# Patient Record
Sex: Female | Born: 1960 | Race: White | Hispanic: No | Marital: Married | Smoking: Never smoker
Health system: Southern US, Community
[De-identification: ages and names within clinical notes are randomized; demographics above are authoritative.]

## PROBLEM LIST (undated history)

## (undated) DIAGNOSIS — E039 Hypothyroidism, unspecified: Secondary | ICD-10-CM

## (undated) DIAGNOSIS — Z8639 Personal history of other endocrine, nutritional and metabolic disease: Secondary | ICD-10-CM

## (undated) DIAGNOSIS — D649 Anemia, unspecified: Secondary | ICD-10-CM

---

## 1988-07-16 HISTORY — PX: DILATION AND CURETTAGE OF UTERUS: SHX78

## 2005-02-22 ENCOUNTER — Ambulatory Visit: Payer: Self-pay | Admitting: Obstetrics and Gynecology

## 2006-05-21 ENCOUNTER — Ambulatory Visit: Payer: Self-pay | Admitting: Obstetrics and Gynecology

## 2006-05-24 ENCOUNTER — Ambulatory Visit: Payer: Self-pay | Admitting: Obstetrics and Gynecology

## 2006-12-19 ENCOUNTER — Ambulatory Visit: Payer: Self-pay | Admitting: Obstetrics and Gynecology

## 2007-07-17 HISTORY — PX: BREAST CYST ASPIRATION: SHX578

## 2007-09-09 ENCOUNTER — Ambulatory Visit: Payer: Self-pay | Admitting: Obstetrics and Gynecology

## 2007-10-03 ENCOUNTER — Ambulatory Visit: Payer: Self-pay | Admitting: Internal Medicine

## 2007-10-16 ENCOUNTER — Ambulatory Visit: Payer: Self-pay | Admitting: Internal Medicine

## 2008-09-13 ENCOUNTER — Ambulatory Visit: Payer: Self-pay | Admitting: Obstetrics and Gynecology

## 2010-03-29 ENCOUNTER — Ambulatory Visit: Payer: Self-pay | Admitting: Internal Medicine

## 2010-05-24 ENCOUNTER — Ambulatory Visit: Payer: Self-pay | Admitting: Obstetrics and Gynecology

## 2012-06-10 ENCOUNTER — Ambulatory Visit: Payer: Self-pay | Admitting: Obstetrics and Gynecology

## 2013-06-23 ENCOUNTER — Ambulatory Visit: Payer: Self-pay | Admitting: Obstetrics and Gynecology

## 2014-01-29 ENCOUNTER — Emergency Department: Payer: Self-pay | Admitting: Emergency Medicine

## 2014-01-29 LAB — CBC WITH DIFFERENTIAL/PLATELET
BASOS ABS: 0.1 10*3/uL (ref 0.0–0.1)
BASOS PCT: 1.2 %
EOS ABS: 0.1 10*3/uL (ref 0.0–0.7)
Eosinophil %: 1.7 %
HCT: 39.3 % (ref 35.0–47.0)
HGB: 13.6 g/dL (ref 12.0–16.0)
LYMPHS ABS: 1.7 10*3/uL (ref 1.0–3.6)
Lymphocyte %: 34.1 %
MCH: 30.7 pg (ref 26.0–34.0)
MCHC: 34.6 g/dL (ref 32.0–36.0)
MCV: 89 fL (ref 80–100)
Monocyte #: 0.4 x10 3/mm (ref 0.2–0.9)
Monocyte %: 7.7 %
Neutrophil #: 2.8 10*3/uL (ref 1.4–6.5)
Neutrophil %: 55.3 %
Platelet: 207 10*3/uL (ref 150–440)
RBC: 4.43 10*6/uL (ref 3.80–5.20)
RDW: 12.6 % (ref 11.5–14.5)
WBC: 5 10*3/uL (ref 3.6–11.0)

## 2014-01-29 LAB — BASIC METABOLIC PANEL
Anion Gap: 2 — ABNORMAL LOW (ref 7–16)
BUN: 13 mg/dL (ref 7–18)
Calcium, Total: 10.7 mg/dL — ABNORMAL HIGH (ref 8.5–10.1)
Chloride: 107 mmol/L (ref 98–107)
Co2: 24 mmol/L (ref 21–32)
Creatinine: 0.78 mg/dL (ref 0.60–1.30)
EGFR (African American): >60
EGFR (Non-African Amer.): >60
Glucose: 133 mg/dL — ABNORMAL HIGH (ref 65–99)
OSMOLALITY: 268 (ref 275–301)
Potassium: 3.5 mmol/L (ref 3.5–5.1)
SODIUM: 133 mmol/L — AB (ref 136–145)

## 2014-01-29 LAB — TROPONIN I

## 2014-01-30 ENCOUNTER — Inpatient Hospital Stay: Payer: Self-pay | Admitting: Internal Medicine

## 2014-01-30 LAB — COMPREHENSIVE METABOLIC PANEL
ALK PHOS: 170 U/L — AB
ALT: 544 U/L — AB (ref 12–78)
Albumin: 4.1 g/dL (ref 3.4–5.0)
Anion Gap: 6 — ABNORMAL LOW (ref 7–16)
BUN: 11 mg/dL (ref 7–18)
Bilirubin,Total: 1.1 mg/dL — ABNORMAL HIGH (ref 0.2–1.0)
CALCIUM: 10.6 mg/dL — AB (ref 8.5–10.1)
CHLORIDE: 103 mmol/L (ref 98–107)
CO2: 27 mmol/L (ref 21–32)
Creatinine: 0.82 mg/dL (ref 0.60–1.30)
EGFR (African American): >60
EGFR (Non-African Amer.): >60
Glucose: 106 mg/dL — ABNORMAL HIGH (ref 65–99)
Osmolality: 272 (ref 275–301)
POTASSIUM: 3.4 mmol/L — AB (ref 3.5–5.1)
SGOT(AST): 656 U/L — ABNORMAL HIGH (ref 15–37)
SODIUM: 136 mmol/L (ref 136–145)
Total Protein: 8 g/dL (ref 6.4–8.2)

## 2014-01-30 LAB — ACETAMINOPHEN LEVEL: ACETAMINOPHEN: 5 ug/mL — AB

## 2014-01-30 LAB — URINALYSIS, COMPLETE
BACTERIA: NONE SEEN
BILIRUBIN, UR: NEGATIVE
BLOOD: NEGATIVE
Glucose,UR: NEGATIVE mg/dL (ref 0–75)
Ketone: NEGATIVE
Nitrite: NEGATIVE
PH: 5 (ref 4.5–8.0)
Protein: NEGATIVE
RBC,UR: 2 /HPF (ref 0–5)
SPECIFIC GRAVITY: 1.016 (ref 1.003–1.030)
WBC UR: 2 /HPF (ref 0–5)

## 2014-01-30 LAB — CBC WITH DIFFERENTIAL/PLATELET
BASOS ABS: 0.1 10*3/uL (ref 0.0–0.1)
Basophil %: 0.9 %
Eosinophil #: 0 10*3/uL (ref 0.0–0.7)
Eosinophil %: 0.1 %
HCT: 39.7 % (ref 35.0–47.0)
HGB: 13.8 g/dL (ref 12.0–16.0)
LYMPHS ABS: 0.2 10*3/uL — AB (ref 1.0–3.6)
LYMPHS PCT: 1.7 %
MCH: 31 pg (ref 26.0–34.0)
MCHC: 34.8 g/dL (ref 32.0–36.0)
MCV: 89 fL (ref 80–100)
Monocyte #: 0.7 x10 3/mm (ref 0.2–0.9)
Monocyte %: 7.5 %
NEUTROS ABS: 8.8 10*3/uL — AB (ref 1.4–6.5)
Neutrophil %: 89.8 %
PLATELETS: 156 10*3/uL (ref 150–440)
RBC: 4.46 10*6/uL (ref 3.80–5.20)
RDW: 12.5 % (ref 11.5–14.5)
WBC: 9.8 10*3/uL (ref 3.6–11.0)

## 2014-01-30 LAB — LIPASE, BLOOD: LIPASE: 180 U/L (ref 73–393)

## 2014-01-30 LAB — PROTIME-INR
INR: 1
Prothrombin Time: 13.1 secs (ref 11.5–14.7)

## 2014-01-31 LAB — HEPATIC FUNCTION PANEL A (ARMC)
Albumin: 3.1 g/dL — ABNORMAL LOW (ref 3.4–5.0)
Alkaline Phosphatase: 119 U/L — ABNORMAL HIGH
BILIRUBIN DIRECT: 0.2 mg/dL (ref 0.00–0.20)
Bilirubin,Total: 0.8 mg/dL (ref 0.2–1.0)
SGOT(AST): 275 U/L — ABNORMAL HIGH (ref 15–37)
SGPT (ALT): 402 U/L — ABNORMAL HIGH (ref 12–78)
TOTAL PROTEIN: 6.2 g/dL — AB (ref 6.4–8.2)

## 2014-01-31 LAB — RAPID HIV SCREEN (HIV 1/2 AB+AG)

## 2014-02-01 LAB — HEPATIC FUNCTION PANEL A (ARMC)
ALT: 270 U/L — AB (ref 12–78)
AST: 133 U/L — AB (ref 15–37)
Albumin: 2.9 g/dL — ABNORMAL LOW (ref 3.4–5.0)
Alkaline Phosphatase: 109 U/L
BILIRUBIN TOTAL: 0.5 mg/dL (ref 0.2–1.0)
Bilirubin, Direct: 0.1 mg/dL (ref 0.00–0.20)
TOTAL PROTEIN: 6.1 g/dL — AB (ref 6.4–8.2)

## 2014-02-05 LAB — CULTURE, BLOOD (SINGLE)

## 2014-11-06 NOTE — Discharge Summary (Signed)
PATIENT NAME:  Marie Griffin, Marie Griffin MR#:  [id number] DATE OF BIRTH:  06-10-1961  DATE OF ADMISSION:  01/30/2014 DATE OF DISCHARGE:  02/01/2014  DISCHARGE DIAGNOSES:  1.  Acute hepatitis, likely viral.  2.  Sepsis.  3.  Dehydration.  4.  Hypothyroidism.  5.  Gallstones.   IMAGING STUDIES: Include: 1.  A chest x-ray, PA and lateral, which showed no acute infiltrates or effusions.  2.  Ultrasound of the abdomen limited survey, right upper quadrant, showed a gallbladder with sludge with cholelithiasis, but no cholecystitis. Normal liver. Normal CBD.   ADMITTING HISTORY AND PHYSICAL: Please see detailed H and P dictated previously. In brief, a 54 year old Caucasian female patient with a history of hypothyroidism presented to the hospital complaining of abdominal pain, fever, and was also found to have elevated LFTs and admitted to the hospitalist service.   HOSPITAL COURSE: Acute hepatitis, likely viral. The patient was admitted onto the medical floor, started initially on ciprofloxacin and Flagyl for possible GI source of the fever. The patient did not have any vomiting, diarrhea. Later, antibiotics have been stopped. Blood cultures have been negative. The patient has been afebrile through her hospital stay. Tachycardia has resolved. Sepsis has resolved with IV fluids. The patient presently has pending labs of hepatitis A, B, C; Epstein-Barr virus; CMV. Her HIV test has come back negative. I have discussed with Dr. Sampson GoonFitzgerald regarding the case and provided the patient his contact details in case she has any recurrent fevers. The patient feels well by day of discharge. No pain, nausea, vomiting. Ambulated well, tolerating food, afebrile. Normal blood pressure, heart rate, and will be discharged back home. Her liver function tests have trended down from 656 and 544 of AST, ALT to 133 and 270. Bilirubin is normal. WBC count is normal. Blood cultures negative.   Patient dehydration has resolved.   PHYSICAL  EXAMINATION: Prior to discharge:  ABDOMEN: The patient has no tenderness on abdominal examination.  HEART: S1 and S2 heard without any murmurs.  LUNGS: Sound clear. EXTREMITIES:  No edema.  SKIN: No rash.   DISCHARGE MEDICATIONS: Levothyroxine 25 mcg oral 1 capsule daily.   DISCHARGE INSTRUCTIONS: Regular food, regular consistency. Return to work on 02/03/2014. Follow up with Dr. Beverely RisenFozia Khan in a week for repeat liver function tests, and please call Dr. Sampson GoonFitzgerald at [phone] if any recurrent fevers.  Time spent on day of discharge in discharge activity was 35 minutes.   ____________________________ Molinda BailiffSrikar R. Cordarrel Stiefel, MD srs:sk D: 02/01/2014 13:08:21 ET T: 02/01/2014 21:35:33 ET JOB#: [id number]  cc: Wardell HeathSrikar R. Elpidio AnisSudini, MD, <Dictator> Lyndon CodeFozia M. Khan, MD Stann Mainlandavid P. Sampson GoonFitzgerald, MD  Orie FishermanSRIKAR R Romani Wilbon MD ELECTRONICALLY SIGNED 02/03/2014 17:23

## 2014-11-06 NOTE — H&P (Signed)
PATIENT NAME:  Marie SkeansRUITT, Astou MR#:  [id number] DATE OF BIRTH:  [birth year]  DATE OF ADMISSION:  01/30/2014  REFERRING PHYSICIAN: Dr. Mayford KnifeWilliams  PRIMARY CARE PHYSICIAN: Dr. Beverely RisenFozia Khan.   CHIEF COMPLAINT: Abdominal pain, fever.   HISTORY OF PRESENT ILLNESS: A 54 year old Caucasian female with history of hypothyroidism, presenting with abdominal pain and fever. She describes two day duration of abdominal pain, epigastric in location, sharp in quality, nonradiating, 10 out of 10 in intensity, intermittent. No worsening or relieving factors. Not associated with food. She actually presented to the hospital yesterday. Her work-up was unrevealing, and she was sent home from the Emergency Department; however, her symptoms recurred, so she has had to re-present to the hospital. She also describes having associated fevers, chills, fatigue, generalized weakness. During initial work-up, noted to be febrile as well as have elevated LFTs. She denies taking any over-the-counter medications or herbal supplements. [place] recent travel [place] than [place] A few months ago, she traveled to [place], about 3 to 4 months ago. She is on testosterone  injections for menopause.    REVIEW OF SYSTEMS:  CONSTITUTIONAL: Positive for fevers, chills, fatigue, weakness as described above.  EYES: Denied blurred vision, double vision or eye pain.  EARS, NOSE, THROAT: Denies tinnitus, ear pain, hearing loss.  RESPIRATORY: Denies cough, wheeze, shortness of breath.  CARDIOVASCULAR: Denies chest pain, palpitations, edema.  GASTROINTESTINAL: Denies nausea, vomiting, diarrhea. Positive for abdominal pain as described above.  GENITOURINARY: Denies dysuria, hematuria.  ENDOCRINE: Denies nocturia or thyroid problems.  HEMATOLOGIC AND LYMPHATIC:  Denies easy bruising, bleeding.  SKIN: Denies rashes or lesions.  MUSCULOSKELETAL: Denies pain in neck, back, shoulder, knees, hips or arthritic symptoms.  NEUROLOGIC: Denies any paralysis or  paresthesias.  PSYCHIATRIC: Denies anxiety or depressive symptoms.  Otherwise, full review of systems is reviewed and is negative.   PAST MEDICAL HISTORY: Hypothyroidism.   SOCIAL HISTORY: Denies any tobacco use. Occasional alcohol use, 1 to 2 alcoholic beverages on weekends. Denies any drug use.   FAMILY HISTORY: Positive for coronary artery disease; however, denies any hepatobiliary illnesses.   ALLERGIES: SULFA DRUGS.   HOME MEDICATIONS: Include levothyroxine 25 mcg p.o. daily.   PHYSICAL EXAMINATION: VITAL SIGNS: Temperature 102.8 degrees Fahrenheit, heart rate 125, respirations 18, blood pressure 125/67, saturating 95% on room air. Weight 56.7 kg, BMI 22.9.  GENERAL: Well appearing Caucasian female who is currently in no acute distress.  HEAD: Normocephalic, atraumatic.  EYES: Pupils equal, round, reactive to light. Extraocular muscles intact. No scleral icterus.  MOUTH: Moist mucous membranes. Dentition intact. No abscess noted.  EARS, NOSE, AND THROAT: Clear, without exudates. No external lesions.  NECK: Supple. No thyromegaly. No nodules. No JVD.  PULMONARY: Clear to auscultation bilaterally without wheezes, rubs or rhonchi. No use of accessory muscles. Good respiratory effort.  CHEST: Nontender to palpation.  CARDIOVASCULAR: S1, S2. Regular rate and rhythm. No murmurs, rubs, or gallops. No edema. Pedal pulses 2+ bilaterally.  GASTROINTESTINAL: Soft, nondistended. Minimal tenderness over epigastric region without rebound or guarding. Murphy's sign is negative. Positive bowel sounds. No hepatosplenomegaly.  MUSCULOSKELETAL: No swelling, clubbing, or edema. Range of motion full in all extremities.  NEUROLOGIC: Cranial nerves II through XII intact. No gross focal neurological deficits. Sensation intact. Reflexes are intact.  SKIN: No ulceration, lesions, rashes, or cyanosis. Skin warm, dry. Turgor intact.  PSYCHIATRIC: Mood and affect within normal limits. The patient is awake,  alert, oriented x3. Insight and judgment intact.   LABORATORY DATA: Sodium 136, potassium 3.4, chloride 103,  bicarbonate 27, BUN 11, creatinine 0.82, glucose 106, calcium 10.6. LFTs: Protein 8, albumin 4.1, bilirubin 1.1, alkaline phosphatase 170, AST 656, ALT 544. WBC 9.8, hemoglobin 13.8, platelets of 156. Urinalysis negative for evidence of infection. INR of 1. Had a chest x-ray performed yesterday, revealed no acute cardiopulmonary process. Had an ultrasound of the right upper quadrant performed today, reveals gallbladder sludge with cholelithiasis, without evidence of acute cholecystitis.   ASSESSMENT AND PLAN: A 54 year old Caucasian female with history of hypothyroidism, presenting with abdominal pain and fever of two days' duration, noted to have elevated liver function tests.  1.  Hepatitis with hepatocellular injury pattern. No clear etiology at this time. Hepatitis panel has been ordered. Of note, she is on testosterone supplementation for about 3 to 4 years in total. These can cause elevations in liver function tests, but it is usually cholestatic versus a mixed pattern picture, as opposed to hepatocellular.  2.  Sepsis, meeting septic criteria by heart rate as well as temperature, with presumptive gastrointestinal source. Cipro, Flagyl antibiotic coverage. Follow cultures. IV fluid hydration. Keep mean arterial pressure greater than 65.  3.  Hypokalemia. Replace to goal of 4 to 5.  4.  Hypothyroidism. Continue with Synthroid.  5.  Deep venous thrombosis prophylaxis with heparin subcutaneous.   CODE STATUS: The patient is full code.   TIME SPENT: 45 minutes.   ____________________________ Cletis Athens. Deaire Mcwhirter, MD dkh:cg D: 01/31/2014 00:12:14 ET T: 01/31/2014 00:52:18 ET JOB#: [id number]  cc: Cletis Athens. Moriya Mitchell, MD, <Dictator> Jozsef Wescoat Synetta Shadow MD ELECTRONICALLY SIGNED 01/31/2014 20:28

## 2014-12-21 ENCOUNTER — Other Ambulatory Visit: Payer: Self-pay | Admitting: Nurse Practitioner

## 2014-12-21 ENCOUNTER — Ambulatory Visit
Admission: RE | Admit: 2014-12-21 | Discharge: 2014-12-21 | Disposition: A | Discharge status: Home / Self Care | Payer: 59 | Source: Ambulatory Visit | Attending: Nurse Practitioner | Admitting: Nurse Practitioner

## 2014-12-21 DIAGNOSIS — M25561 Pain in right knee: Secondary | ICD-10-CM | POA: Insufficient documentation

## 2014-12-21 DIAGNOSIS — M79651 Pain in right thigh: Secondary | ICD-10-CM

## 2014-12-21 DIAGNOSIS — M25551 Pain in right hip: Secondary | ICD-10-CM

## 2015-04-12 ENCOUNTER — Other Ambulatory Visit: Payer: Self-pay | Admitting: Obstetrics and Gynecology

## 2015-09-26 ENCOUNTER — Other Ambulatory Visit: Payer: Self-pay | Admitting: Obstetrics and Gynecology

## 2015-09-26 DIAGNOSIS — Z1231 Encounter for screening mammogram for malignant neoplasm of breast: Secondary | ICD-10-CM

## 2015-10-03 ENCOUNTER — Ambulatory Visit
Admission: RE | Admit: 2015-10-03 | Discharge: 2015-10-03 | Disposition: A | Discharge status: Home / Self Care | Payer: 59 | Source: Ambulatory Visit | Attending: Obstetrics and Gynecology | Admitting: Obstetrics and Gynecology

## 2015-10-03 DIAGNOSIS — Z1231 Encounter for screening mammogram for malignant neoplasm of breast: Secondary | ICD-10-CM | POA: Diagnosis not present

## 2015-12-30 ENCOUNTER — Encounter: Admission: RE | Payer: Self-pay | Source: Ambulatory Visit

## 2015-12-30 ENCOUNTER — Ambulatory Visit: Admission: RE | Admit: 2015-12-30 | Payer: 59 | Source: Ambulatory Visit | Admitting: Gastroenterology

## 2015-12-30 SURGERY — COLONOSCOPY WITH PROPOFOL
Anesthesia: General

## 2016-07-07 ENCOUNTER — Encounter: Payer: Self-pay | Admitting: Emergency Medicine

## 2016-07-07 DIAGNOSIS — R1013 Epigastric pain: Secondary | ICD-10-CM | POA: Diagnosis present

## 2016-07-07 DIAGNOSIS — E039 Hypothyroidism, unspecified: Secondary | ICD-10-CM | POA: Diagnosis not present

## 2016-07-07 DIAGNOSIS — K8020 Calculus of gallbladder without cholecystitis without obstruction: Secondary | ICD-10-CM | POA: Insufficient documentation

## 2016-07-07 LAB — URINALYSIS, COMPLETE (UACMP) WITH MICROSCOPIC
BACTERIA UA: NONE SEEN
Bilirubin Urine: NEGATIVE
GLUCOSE, UA: NEGATIVE mg/dL
HGB URINE DIPSTICK: NEGATIVE
Ketones, ur: NEGATIVE mg/dL
NITRITE: NEGATIVE
PROTEIN: NEGATIVE mg/dL
Specific Gravity, Urine: 1.006 (ref 1.005–1.030)
pH: 5 (ref 5.0–8.0)

## 2016-07-07 LAB — LIPASE, BLOOD: LIPASE: 35 U/L (ref 11–51)

## 2016-07-07 LAB — COMPREHENSIVE METABOLIC PANEL
ALT: 24 U/L (ref 14–54)
ANION GAP: 9 (ref 5–15)
AST: 44 U/L — AB (ref 15–41)
Albumin: 4.8 g/dL (ref 3.5–5.0)
Alkaline Phosphatase: 51 U/L (ref 38–126)
BUN: 12 mg/dL (ref 6–20)
CO2: 24 mmol/L (ref 22–32)
Calcium: 11.4 mg/dL — ABNORMAL HIGH (ref 8.9–10.3)
Chloride: 107 mmol/L (ref 101–111)
Creatinine, Ser: 0.76 mg/dL (ref 0.44–1.00)
Glucose, Bld: 109 mg/dL — ABNORMAL HIGH (ref 65–99)
POTASSIUM: 3.6 mmol/L (ref 3.5–5.1)
Sodium: 140 mmol/L (ref 135–145)
Total Bilirubin: 0.7 mg/dL (ref 0.3–1.2)
Total Protein: 7.5 g/dL (ref 6.5–8.1)

## 2016-07-07 LAB — CBC
HEMATOCRIT: 37.3 % (ref 35.0–47.0)
Hemoglobin: 13.1 g/dL (ref 12.0–16.0)
MCH: 30.9 pg (ref 26.0–34.0)
MCHC: 35.2 g/dL (ref 32.0–36.0)
MCV: 87.8 fL (ref 80.0–100.0)
Platelets: 218 10*3/uL (ref 150–440)
RBC: 4.25 MIL/uL (ref 3.80–5.20)
RDW: 12.1 % (ref 11.5–14.5)
WBC: 5.6 10*3/uL (ref 3.6–11.0)

## 2016-07-07 NOTE — ED Triage Notes (Signed)
[facility], reports upper abd pain intermittent over past couple days, described as squeezing, reports worse after eating.  Pt reports she was told she had gall stones year ago.

## 2016-07-08 ENCOUNTER — Emergency Department: Payer: 59

## 2016-07-08 ENCOUNTER — Emergency Department
Admission: EM | Admit: 2016-07-08 | Discharge: 2016-07-08 | Disposition: A | Discharge status: Home / Self Care | Payer: 59 | Attending: Emergency Medicine | Admitting: Emergency Medicine

## 2016-07-08 DIAGNOSIS — K8020 Calculus of gallbladder without cholecystitis without obstruction: Secondary | ICD-10-CM

## 2016-07-08 DIAGNOSIS — R1013 Epigastric pain: Secondary | ICD-10-CM

## 2016-07-08 DIAGNOSIS — R109 Unspecified abdominal pain: Secondary | ICD-10-CM

## 2016-07-08 HISTORY — DX: Hypothyroidism, unspecified: E03.9

## 2016-07-08 LAB — TROPONIN I: Troponin I: <0.03 ng/mL (ref <0.03)

## 2016-07-08 MED ORDER — OXYCODONE-ACETAMINOPHEN 5-325 MG PO TABS
1.0000 | ORAL_TABLET | Freq: Once | ORAL | Status: AC
Start: 2016-07-08 — End: 2016-07-08
  Administered 2016-07-08: 1 via ORAL
  Filled 2016-07-08: qty 1

## 2016-07-08 MED ORDER — FENTANYL CITRATE (PF) 100 MCG/2ML IJ SOLN
INTRAMUSCULAR | Status: AC
  Administered 2016-07-08: 50 ug via NASAL
  Filled 2016-07-08: qty 2

## 2016-07-08 MED ORDER — ONDANSETRON 4 MG PO TBDP
4.0000 mg | ORAL_TABLET | Freq: Once | ORAL | Status: AC | PRN
  Administered 2016-07-08: 4 mg via ORAL

## 2016-07-08 MED ORDER — MORPHINE SULFATE (PF) 4 MG/ML IV SOLN
4.0000 mg | Freq: Once | INTRAVENOUS | Status: AC
  Administered 2016-07-08: 4 mg via INTRAVENOUS
  Filled 2016-07-08: qty 1

## 2016-07-08 MED ORDER — SUCRALFATE 1 G PO TABS: 1.0000 g | ORAL_TABLET | Freq: Two times a day (BID) | ORAL | 0 refills | Status: DC

## 2016-07-08 MED ORDER — ONDANSETRON 4 MG PO TBDP
ORAL_TABLET | ORAL | Status: DC
Start: 2016-07-08 — End: 2016-07-08
  Filled 2016-07-08: qty 1

## 2016-07-08 MED ORDER — GI COCKTAIL ~~LOC~~
30.0000 mL | Freq: Once | ORAL | Status: AC
  Administered 2016-07-08: 30 mL via ORAL
  Filled 2016-07-08: qty 30

## 2016-07-08 MED ORDER — ONDANSETRON 4 MG PO TBDP: 4.0000 mg | ORAL_TABLET | Freq: Three times a day (TID) | ORAL | 0 refills | Status: DC | PRN

## 2016-07-08 MED ORDER — FENTANYL CITRATE (PF) 100 MCG/2ML IJ SOLN
50.0000 ug | INTRAMUSCULAR | Status: DC | PRN
  Administered 2016-07-08: 50 ug via NASAL

## 2016-07-08 MED ORDER — OXYCODONE-ACETAMINOPHEN 5-325 MG PO TABS: 1.0000 | ORAL_TABLET | Freq: Four times a day (QID) | ORAL | 0 refills | Status: DC | PRN

## 2016-07-08 NOTE — ED Notes (Signed)
Pt presents to ED 12 c/o RUQ squeezing pain radiating to the back x2 days, pt states pain progressively getting worse, states pain at 8/10 right now.  Pt states history of being diagnosed with gall stones about a year ago. Pt denies any vomiting or diarrhea or blood in stool; pt states experiencing nausea. Pt is alert and oriented x4

## 2016-07-08 NOTE — ED Provider Notes (Signed)
[facility] Emergency Department Provider Note   ____________________________________________   First MD Initiated Contact with Patient 07/08/16 0157     (approximate)  I have reviewed the triage vital signs and the nursing notes.   HISTORY  Chief Complaint Abdominal Pain    HPI Marie Griffin is a 55 y.o. female who comes into the hospital today with some abdominal pain. The patient reports that she is having a gallbladder attack. She reports this started on Friday afternoon. The patient had some epigastric pain and tightness. She also had pressure into her sides. It lasted about 15 minutes and then went away. She reports that she had another episode this morning and then again this evening. She reports that she's had some nausea with no vomiting and denies any fever. About a year and a half ago the patient had similar symptoms and was told that she had stones and sludge in her gallbladder. The patient never followed up with the surgeon and she reports that she didn't think it was a big deal. He problems with that since. The patient reports that when the pain comes on it does seem to take her breath away but otherwise denies any chest pain, shortness of breath, diarrhea or constipation. The patient is here this evening for evaluation and control of her pain. The patient rates her pain a 5 out of 10 in intensity.   Past Medical History:  Diagnosis Date  . Hypothyroid     There are no active problems to display for this patient.   Past Surgical History:  Procedure Laterality Date  . BREAST CYST ASPIRATION Right 2009   neg    Prior to Admission medications   Not on File    Allergies Sulfa antibiotics  Family History  Problem Relation Age of Onset  . Breast cancer Neg Hx     Social History Social History  Substance Use Topics  . Smoking status: Never Smoker  . Smokeless tobacco: Never Used  . Alcohol use Yes    Review of  Systems Constitutional: No fever/chills Eyes: No visual changes. ENT: No sore throat. Cardiovascular: Denies chest pain. Respiratory: Denies shortness of breath. Gastrointestinal:  abdominal pain, nausea, no vomiting.  No diarrhea.  No constipation. Genitourinary: Negative for dysuria. Musculoskeletal: Negative for back pain. Skin: Negative for rash. Neurological: Negative for headaches, focal weakness or numbness.  10-point ROS otherwise negative.  ____________________________________________   PHYSICAL EXAM:  VITAL SIGNS: ED Triage Vitals  Enc Vitals Group     BP 07/07/16 2234 124/74     Pulse Rate 07/07/16 2234 72     Resp 07/07/16 2234 18     Temp 07/07/16 2234 97.6 F (36.4 C)     Temp Source 07/07/16 2234 Oral     SpO2 07/07/16 2234 100 %     Weight 07/07/16 2235 125 lb (56.7 kg)     Height 07/07/16 2235 5\' 3"  (1.6 m)     Head Circumference --      Peak Flow --      Pain Score 07/07/16 2235 5     Pain Loc --      Pain Edu? --      Excl. in GC? --     Constitutional: Alert and oriented. Well appearing and in moderate distress. Eyes: Conjunctivae are normal. PERRL. EOMI. Head: Atraumatic. Nose: No congestion/rhinnorhea. Mouth/Throat: Mucous membranes are moist.  Oropharynx non-erythematous. Cardiovascular: Normal rate, regular rhythm. Grossly normal heart sounds.  Good peripheral circulation. Respiratory:  Normal respiratory effort.  No retractions. Lungs CTAB. Gastrointestinal: Sof twith epigastric tenderness to palpation. No distention. Positive bowel sounds Musculoskeletal: No lower extremity tenderness nor edema.   Neurologic:  Normal speech and language.  Skin:  Skin is warm, dry and intact.  Psychiatric: Mood and affect are normal.   ____________________________________________   LABS (all labs ordered are listed, but only abnormal results are displayed)  Labs Reviewed  COMPREHENSIVE METABOLIC PANEL - Abnormal; Notable for the following:        Result Value   Glucose, Bld 109 (*)    Calcium 11.4 (*)    AST 44 (*)    All other components within normal limits  URINALYSIS, COMPLETE (UACMP) WITH MICROSCOPIC - Abnormal; Notable for the following:    Color, Urine YELLOW (*)    APPearance CLEAR (*)    Leukocytes, UA TRACE (*)    Squamous Epithelial / LPF 0-5 (*)    All other components within normal limits  LIPASE, BLOOD  CBC  TROPONIN I   ____________________________________________  EKG  none ____________________________________________  RADIOLOGY  US abd RUQ ____________________________________________   PROCEDURES  Procedure(s) performed: None  Procedures  Critical Care performed: No  ____________________________________________   INITIAL IMPRESSION / ASSESSMENT AND PLAN / ED COURSE  Pertinent labs & imaging results that were available during my care of the patient were reviewed by me and considered in my medical decision making (see chart for details).  This is a 55 year old female who comes into the hospital today with some epigastric and right upper quadrant abdominal pain. The patient reports that this is a gallbladder attack. She does have stones in her gallbladder.  We did send some blood work to evaluate the patient and it is unremarkable. The patient's lipase is negative and the remaining blood work is also negative. I sent the patient for an ultrasound of her gallbladder. I gave the patient a dose of morphine, Zofran and a GI cocktail. I will reassess the patient.  Clinical Course as of Jul 09 423  Wynelle LinkSun Jul 08, 2016  [id number] Cholelithiasis. Generous caliber of the extrahepatic ducts. Cannot exclude choledocholithiasis.   US Abdomen Limited RUQ [AW]    Clinical Course User Index [AW] Rebecka ApleyAllison P Savino Whisenant, MD   After the medication the patient reports her pain is improved. She does have cholelithiasis with some generous extrahepatic ducts but no signs of cholecystitis. As the patient's pain is  improved and her liver enzymes and blood work is unremarkable he'll that the patient can be discharged home. I discussed with the patient that I will have her follow-up with surgery to have her gallbladder removed. I did inform her though that should anything worsen she should return to emergency department for further evaluation. The patient received a dose of Percocet to help with her pain should it return when she arrives home and I will give the patient a prescription for medication. She'll be discharged to follow-up.  ____________________________________________   FINAL CLINICAL IMPRESSION(S) / ED DIAGNOSES  Final diagnoses:  Abdominal pain      NEW MEDICATIONS STARTED DURING THIS VISIT:  New Prescriptions   No medications on file     Note:  This document was prepared using Dragon voice recognition software and may include unintentional dictation errors.    Rebecka ApleyAllison P Reeanna Acri, MD 07/08/16 [phone]

## 2016-07-08 NOTE — Discharge Instructions (Signed)
Please follow-up with the surgeon for further evaluation of her gallbladder. If the pain should return or worsen or if he have any further symptoms return to the emergency department.

## 2016-07-08 NOTE — ED Notes (Signed)
Pt is in good condition; discharge instructions reviewed, follow up care and home care reviewed; prescription medication reviewed; pt verbalized understanding; pt is ambulatory and left with husband

## 2016-07-10 ENCOUNTER — Other Ambulatory Visit: Payer: Self-pay

## 2016-07-10 DIAGNOSIS — E079 Disorder of thyroid, unspecified: Secondary | ICD-10-CM | POA: Insufficient documentation

## 2016-07-12 ENCOUNTER — Encounter: Payer: Self-pay | Admitting: Surgery

## 2016-07-12 ENCOUNTER — Telehealth: Payer: Self-pay | Admitting: Surgery

## 2016-07-12 ENCOUNTER — Ambulatory Visit (INDEPENDENT_AMBULATORY_CARE_PROVIDER_SITE_OTHER): Payer: 59 | Admitting: Surgery

## 2016-07-12 VITALS — BP 128/88 | HR 90 | Temp 98.3°F | Ht 63.0 in | Wt 125.2 lb

## 2016-07-12 DIAGNOSIS — K8050 Calculus of bile duct without cholangitis or cholecystitis without obstruction: Secondary | ICD-10-CM

## 2016-07-12 NOTE — Progress Notes (Signed)
  Surgical Consultation  07/12/2016  Marie SkeansLori Griffin is an 55 y.o. female.   CC: Gallstones  HPI: This patient was in the emergency room recently with right upper quadrant pain and a workup showing gallstones without acute cholecystitis. She was hospitalized 2 years ago with elevated liver function tests and known gallstones. She's had several attacks during that time frame but most recently she's had several attacks with fatty food intolerance nausea but no emesis no fevers or chills. No jaundice or acholic stools  No family history of gallbladder disease   Past Medical History:  Diagnosis Date  . Hypothyroid     Past Surgical History:  Procedure Laterality Date  . BREAST CYST ASPIRATION Right 2009   neg    Family History  Problem Relation Age of Onset  . Breast cancer Neg Hx     Social History:  reports that she has never smoked. She has never used smokeless tobacco. She reports that she drinks alcohol. Her drug history is not on file.  Allergies:  Allergies  Allergen Reactions  . Sulfa Antibiotics Rash    Lips swelling, rash    Medications reviewed.   Review of Systems:   Review of Systems  Constitutional: Negative.   HENT: Negative.   Eyes: Negative.   Respiratory: Negative.   Cardiovascular: Negative.   Gastrointestinal: Positive for abdominal pain and nausea. Negative for blood in stool, constipation, diarrhea, heartburn, melena and vomiting.  Genitourinary: Negative.   Musculoskeletal: Negative.   Skin: Negative.   Neurological: Negative.   Endo/Heme/Allergies: Negative.   Psychiatric/Behavioral: Negative.      Physical Exam:  There were no vitals taken for this visit.  Physical Exam  Constitutional: She is oriented to person, place, and time and well-developed, well-nourished, and in no distress. No distress.  HENT:  Head: Normocephalic and atraumatic.  Eyes: Pupils are equal, round, and reactive to light. Right eye exhibits no discharge. Left  eye exhibits no discharge. No scleral icterus.  Neck: Normal range of motion.  Cardiovascular: Normal rate, regular rhythm and normal heart sounds.   Pulmonary/Chest: Effort normal and breath sounds normal. No respiratory distress. She has no wheezes. She has no rales.  Abdominal: Soft. She exhibits no distension. There is no tenderness. There is no rebound and no guarding.  Musculoskeletal: Normal range of motion. She exhibits no edema.  Lymphadenopathy:    She has no cervical adenopathy.  Neurological: She is alert and oriented to person, place, and time.  Skin: Skin is warm and dry. No rash noted. She is not diaphoretic. No erythema.  Psychiatric: Mood and affect normal.  Vitals reviewed.     No results found for this or any previous visit (from the past 48 hour(s)). No results found.  Assessment/Plan:  This a patient with a history of elevated liver function test 2 years ago and known gallstones but her LFTs are been normal recently. She likely is experiencing recurrent biliary colic. I recommend laparoscopic cholecystectomy for control of her symptoms the options of observation of been reviewed the risk of bleeding infection recurrence of symptoms failure to resolve all of her symptoms bile duct damage bile duct leak retained common bile duct stone any of which could require further surgery and/or ERCP stent papillotomy of been discussed she and her husband understood and agreed with this plan  Lattie Hawichard E Cooper, MD, FACS

## 2016-07-12 NOTE — Telephone Encounter (Signed)
Pt advised of pre op date/time and sx date. Sx: 07/20/16 with Dr Ludwig Clarksooper--Laparoscopic cholecystectomy.  Pre op: 07/17/16 between 1-5:00pm--Phone.   Patient made aware to call [phone], between 1-3:00pm the day before surgery, to find out what time to arrive.

## 2016-07-12 NOTE — Patient Instructions (Addendum)
You have requested to have your gallbladder removed. We will arrange for this to be done on 07/20/16 at [facility] with Dr. Excell Seltzerooper.  You will most likely be out of work 1-2 weeks for this surgery. You will return after your post-op appointment with a lifting restriction for approximately 4 more weeks.  You will be able to eat anything you would like to following surgery. But, start by eating a bland diet and advance this as tolerated.  Please see the (blue)pre-care form that you have been given today. If you have any questions, please call our office.  Laparoscopic Cholecystectomy Laparoscopic cholecystectomy is surgery to remove the gallbladder. The gallbladder is located in the upper right part of the abdomen, behind the liver. It is a storage sac for bile, which is produced in the liver. Bile aids in the digestion and absorption of fats. Cholecystectomy is often done for inflammation of the gallbladder (cholecystitis). This condition is usually caused by a buildup of gallstones (cholelithiasis) in the gallbladder. Gallstones can block the flow of bile, and that can result in inflammation and pain. In severe cases, emergency surgery may be required. If emergency surgery is not required, you will have time to prepare for the procedure. Laparoscopic surgery is an alternative to open surgery. Laparoscopic surgery has a shorter recovery time. Your common bile duct may also need to be examined during the procedure. If stones are found in the common bile duct, they may be removed. LET [facility] KNOW ABOUT:  Any allergies you have.  All medicines you are taking, including vitamins, herbs, eye drops, creams, and over-the-counter medicines.  Previous problems you or members of your family have had with the use of anesthetics.  Any blood disorders you have.  Previous surgeries you have had.    Any medical conditions you have. RISKS AND COMPLICATIONS Generally, this is a safe  procedure. However, problems may occur, including:  Infection.  Bleeding.  Allergic reactions to medicines.  Damage to other structures or organs.  A stone remaining in the common bile duct.  A bile leak from the cyst duct that is clipped when your gallbladder is removed.  The need to convert to open surgery, which requires a larger incision in the abdomen. This may be necessary if your surgeon thinks that it is not safe to continue with a laparoscopic procedure. BEFORE THE PROCEDURE  Ask your health care provider about:  Changing or stopping your regular medicines. This is especially important if you are taking diabetes medicines or blood thinners.  Taking medicines such as aspirin and ibuprofen. These medicines can thin your blood. Do not take these medicines before your procedure if your health care provider instructs you not to.  Follow instructions from your health care provider about eating or drinking restrictions.  Let your health care provider know if you develop a cold or an infection before surgery.  Plan to have someone take you home after the procedure.  Ask your health care provider how your surgical site will be marked or identified.  You may be given antibiotic medicine to help prevent infection. PROCEDURE  To reduce your risk of infection:  Your health care team will wash or sanitize their hands.  Your skin will be washed with soap.  An IV tube may be inserted into one of your veins.  You will be given a medicine to make you fall asleep (general anesthetic).  A breathing tube will be placed in your mouth.  The surgeon  will make several small cuts (incisions) in your abdomen.  A thin, lighted tube (laparoscope) that has a tiny camera on the end will be inserted through one of the small incisions. The camera on the laparoscope will send a picture to a TV screen (monitor) in the operating room. This will give the surgeon a good view inside your  abdomen.  A gas will be pumped into your abdomen. This will expand your abdomen to give the surgeon more room to perform the surgery.  Other tools that are needed for the procedure will be inserted through the other incisions. The gallbladder will be removed through one of the incisions.  After your gallbladder has been removed, the incisions will be closed with stitches (sutures), staples, or skin glue.  Your incisions may be covered with a bandage (dressing). The procedure may vary among health care providers and hospitals. AFTER THE PROCEDURE  Your blood pressure, heart rate, breathing rate, and blood oxygen level will be monitored often until the medicines you were given have worn off.  You will be given medicines as needed to control your pain.   This information is not intended to replace advice given to you by your health care provider. Make sure you discuss any questions you have with your health care provider.   Document Released: 07/02/2005 Document Revised: 03/23/2015 Document Reviewed: 02/11/2013 Elsevier Interactive Patient Education 2016 [place] Diet for Pancreatitis or Gallbladder Conditions A low-fat diet can be helpful if you have pancreatitis or a gallbladder condition. With these conditions, your pancreas and gallbladder have trouble digesting fats. A healthy eating plan with less fat will help rest your pancreas and gallbladder and reduce your symptoms. WHAT DO I NEED TO KNOW ABOUT THIS DIET?  Eat a low-fat diet.  Reduce your fat intake to less than 20-30% of your total daily calories. This is less than 50-60 g of fat per day.  Remember that you need some fat in your diet. Ask your dietician what your daily goal should be.  Choose nonfat and low-fat healthy foods. Look for the words "nonfat," "low fat," or "fat free."  As a guide, look on the label and choose foods with less than 3 g of fat per serving. Eat only one serving.  Avoid  alcohol.  Do not smoke. If you need help quitting, talk with your health care provider.  Eat small frequent meals instead of three large heavy meals. WHAT FOODS CAN I EAT? Grains Include healthy grains and starches such as potatoes, wheat bread, fiber-rich cereal, and brown rice. Choose whole grain options whenever possible. In adults, whole grains should account for 45-65% of your daily calories.  Fruits and Vegetables Eat plenty of fruits and vegetables. Fresh fruits and vegetables add fiber to your diet. Meats and Other Protein Sources Eat lean meat such as chicken and pork. Trim any fat off of meat before cooking it. Eggs, fish, and beans are other sources of protein. In adults, these foods should account for 10-35% of your daily calories. Dairy Choose low-fat milk and dairy options. Dairy includes fat and protein, as well as calcium.  Fats and Oils Limit high-fat foods such as fried foods, sweets, baked goods, sugary drinks.  Other Creamy sauces and condiments, such as mayonnaise, can add extra fat. Think about whether or not you need to use them, or use smaller amounts or low fat options. WHAT FOODS ARE NOT RECOMMENDED?  High fat foods, such as:  [employer].  Ice  cream.  French toast.  Sweet rolls.  Pizza.  Cheese bread.  Foods covered with batter, butter, creamy sauces, or cheese.  Fried foods.  Sugary drinks and desserts.  Foods that cause gas or bloating   This information is not intended to replace advice given to you by your health care provider. Make sure you discuss any questions you have with your health care provider.   Document Released: 07/07/2013 Document Reviewed: 07/07/2013 Elsevier Interactive Patient Education [employer].

## 2016-07-17 ENCOUNTER — Encounter
Admission: RE | Admit: 2016-07-17 | Discharge: 2016-07-17 | Disposition: A | Discharge status: Home / Self Care | Payer: 59 | Source: Ambulatory Visit | Attending: Surgery | Admitting: Surgery

## 2016-07-17 HISTORY — DX: Anemia, unspecified: D64.9

## 2016-07-17 NOTE — Patient Instructions (Signed)
  Your procedure is scheduled on: 07-20-16 Report to Same Day Surgery 2nd floor medical mall [facility](Medical Mall Entrance-take elevator on left to 2nd floor.  Check in with surgery information desk.) To find out your arrival time please call [phone] between 1PM - 3PM on  07-19-16  Remember: Instructions that are not followed completely may result in serious medical risk, up to and including death, or upon the discretion of your surgeon and anesthesiologist your surgery may need to be rescheduled.    _x___ 1. Do not eat food or drink liquids after midnight. No gum chewing or hard candies.     __x__ 2. No Alcohol for 24 hours before or after surgery.   __x__3. No Smoking for 24 prior to surgery.   ____  4. Bring all medications with you on the day of surgery if instructed.    __x__ 5. Notify your doctor if there is any change in your medical condition     (cold, fever, infections).     Do not wear jewelry, make-up, hairpins, clips or nail polish.  Do not wear lotions, powders, or perfumes. You may wear deodorant.  Do not shave 48 hours prior to surgery. Men may shave face and neck.  Do not bring valuables to the hospital.    [facility] is not responsible for any belongings or valuables.               Contacts, dentures or bridgework may not be worn into surgery.  Leave your suitcase in the car. After surgery it may be brought to your room.  For patients admitted to the hospital, discharge time is determined by your treatment team.   Patients discharged the day of surgery will not be allowed to drive home.  You will need someone to drive you home and stay with you the night of your procedure.    Please read over the following fact sheets that you were given:   [facility] Preparing for Surgery and or MRSA Information   _x___ Take these medicines the morning of surgery with A SIP OF WATER:    1. LIOTHYRONINE  2.  3.  4.  5.  6.  ____Fleets enema or Magnesium Citrate as  directed.   ____ Use CHG Soap or sage wipes as directed on instruction sheet   ____ Use inhalers on the day of surgery and bring to hospital day of surgery  ____ Stop metformin 2 days prior to surgery    ____ Take 1/2 of usual insulin dose the night before surgery and none on the morning of surgery.   ____ Stop Aspirin, Coumadin, Pllavix ,Eliquis, Effient, or Pradaxa  x__ Stop Anti-inflammatories such as Advil, Aleve, Ibuprofen, Motrin, Naproxen,          Naprosyn, Goodies powders or aspirin products NOW- Ok to take Tylenol OR PERCOCET IF NEEDED   ____ Stop supplements until after surgery.    ____ Bring C-Pap to the hospital.

## 2016-07-19 ENCOUNTER — Encounter: Payer: Self-pay | Admitting: *Deleted

## 2016-07-20 ENCOUNTER — Ambulatory Visit: Payer: 59 | Admitting: Certified Registered"

## 2016-07-20 ENCOUNTER — Encounter: Payer: Self-pay | Admitting: *Deleted

## 2016-07-20 ENCOUNTER — Ambulatory Visit
Admission: RE | Admit: 2016-07-20 | Discharge: 2016-07-20 | Disposition: A | Discharge status: Home / Self Care | Payer: 59 | Source: Ambulatory Visit | Attending: Surgery | Admitting: Surgery

## 2016-07-20 ENCOUNTER — Encounter
Admission: RE | Disposition: A | Discharge status: Home / Self Care | Payer: Self-pay | Source: Ambulatory Visit | Attending: Surgery

## 2016-07-20 ENCOUNTER — Ambulatory Visit: Payer: 59

## 2016-07-20 DIAGNOSIS — D649 Anemia, unspecified: Secondary | ICD-10-CM | POA: Insufficient documentation

## 2016-07-20 DIAGNOSIS — K8064 Calculus of gallbladder and bile duct with chronic cholecystitis without obstruction: Secondary | ICD-10-CM | POA: Diagnosis not present

## 2016-07-20 DIAGNOSIS — E039 Hypothyroidism, unspecified: Secondary | ICD-10-CM | POA: Insufficient documentation

## 2016-07-20 DIAGNOSIS — K8050 Calculus of bile duct without cholangitis or cholecystitis without obstruction: Secondary | ICD-10-CM | POA: Diagnosis present

## 2016-07-20 DIAGNOSIS — K819 Cholecystitis, unspecified: Secondary | ICD-10-CM

## 2016-07-20 HISTORY — DX: Personal history of other endocrine, nutritional and metabolic disease: Z86.39

## 2016-07-20 HISTORY — PX: CHOLECYSTECTOMY: SHX55

## 2016-07-20 SURGERY — LAPAROSCOPIC CHOLECYSTECTOMY
Anesthesia: General | Wound class: Clean Contaminated

## 2016-07-20 MED ORDER — FAMOTIDINE 20 MG PO TABS
20.0000 mg | ORAL_TABLET | Freq: Once | ORAL | Status: AC
  Administered 2016-07-20: 20 mg via ORAL

## 2016-07-20 MED ORDER — CHLORHEXIDINE GLUCONATE CLOTH 2 % EX PADS: 6.0000 | MEDICATED_PAD | Freq: Once | CUTANEOUS | Status: DC

## 2016-07-20 MED ORDER — ONDANSETRON HCL 4 MG/2ML IJ SOLN
4.0000 mg | Freq: Once | INTRAMUSCULAR | Status: AC | PRN
  Administered 2016-07-20: 4 mg via INTRAVENOUS

## 2016-07-20 MED ORDER — FENTANYL CITRATE (PF) 100 MCG/2ML IJ SOLN
INTRAMUSCULAR | Status: DC | PRN
  Administered 2016-07-20 (×2): 50 ug via INTRAVENOUS
  Administered 2016-07-20: 100 ug via INTRAVENOUS

## 2016-07-20 MED ORDER — CHLORHEXIDINE GLUCONATE CLOTH 2 % EX PADS
6.0000 | MEDICATED_PAD | Freq: Once | CUTANEOUS | Status: DC
Start: 2016-07-20 — End: 2016-07-20

## 2016-07-20 MED ORDER — ACETAMINOPHEN 500 MG PO TABS
1000.0000 mg | ORAL_TABLET | ORAL | Status: AC
  Administered 2016-07-20: 1000 mg via ORAL

## 2016-07-20 MED ORDER — HEPARIN SODIUM (PORCINE) 5000 UNIT/ML IJ SOLN
5000.0000 [IU] | Freq: Once | INTRAMUSCULAR | Status: AC
  Administered 2016-07-20: 5000 [IU] via SUBCUTANEOUS

## 2016-07-20 MED ORDER — MIDAZOLAM HCL 2 MG/2ML IJ SOLN
INTRAMUSCULAR | Status: AC
  Filled 2016-07-20: qty 2

## 2016-07-20 MED ORDER — ROCURONIUM BROMIDE 100 MG/10ML IV SOLN
INTRAVENOUS | Status: DC | PRN
  Administered 2016-07-20: 30 mg via INTRAVENOUS

## 2016-07-20 MED ORDER — MIDAZOLAM HCL 2 MG/2ML IJ SOLN
INTRAMUSCULAR | Status: DC | PRN
  Administered 2016-07-20: 2 mg via INTRAVENOUS

## 2016-07-20 MED ORDER — DEXAMETHASONE SODIUM PHOSPHATE 10 MG/ML IJ SOLN
INTRAMUSCULAR | Status: DC | PRN
  Administered 2016-07-20: 10 mg via INTRAVENOUS

## 2016-07-20 MED ORDER — ONDANSETRON HCL 4 MG/2ML IJ SOLN
INTRAMUSCULAR | Status: AC
  Filled 2016-07-20: qty 2

## 2016-07-20 MED ORDER — PROPOFOL 10 MG/ML IV BOLUS
INTRAVENOUS | Status: DC | PRN
  Administered 2016-07-20: 60 mg via INTRAVENOUS
  Administered 2016-07-20: 140 mg via INTRAVENOUS
  Administered 2016-07-20: 100 mg via INTRAVENOUS

## 2016-07-20 MED ORDER — LIDOCAINE HCL (CARDIAC) 20 MG/ML IV SOLN
INTRAVENOUS | Status: DC | PRN
  Administered 2016-07-20: 60 mg via INTRAVENOUS

## 2016-07-20 MED ORDER — LACTATED RINGERS IV SOLN
INTRAVENOUS | Status: DC
  Administered 2016-07-20 (×3): via INTRAVENOUS

## 2016-07-20 MED ORDER — PROPOFOL 10 MG/ML IV BOLUS
INTRAVENOUS | Status: AC
  Filled 2016-07-20: qty 20

## 2016-07-20 MED ORDER — SUGAMMADEX SODIUM 200 MG/2ML IV SOLN
INTRAVENOUS | Status: DC | PRN
  Administered 2016-07-20: 110 mg via INTRAVENOUS

## 2016-07-20 MED ORDER — FAMOTIDINE 20 MG PO TABS
ORAL_TABLET | ORAL | Status: AC
  Administered 2016-07-20: 20 mg via ORAL
  Filled 2016-07-20: qty 1

## 2016-07-20 MED ORDER — IOTHALAMATE MEGLUMINE 60 % INJ SOLN
INTRAMUSCULAR | Status: DC | PRN
  Administered 2016-07-20: 25 mL

## 2016-07-20 MED ORDER — ROCURONIUM BROMIDE 50 MG/5ML IV SOSY
PREFILLED_SYRINGE | INTRAVENOUS | Status: AC
  Filled 2016-07-20: qty 5

## 2016-07-20 MED ORDER — HEPARIN SODIUM (PORCINE) 5000 UNIT/ML IJ SOLN
INTRAMUSCULAR | Status: AC
  Administered 2016-07-20: 5000 [IU] via SUBCUTANEOUS
  Filled 2016-07-20: qty 1

## 2016-07-20 MED ORDER — SUGAMMADEX SODIUM 200 MG/2ML IV SOLN
INTRAVENOUS | Status: AC
  Filled 2016-07-20: qty 2

## 2016-07-20 MED ORDER — LIDOCAINE 2% (20 MG/ML) 5 ML SYRINGE
INTRAMUSCULAR | Status: AC
  Filled 2016-07-20: qty 5

## 2016-07-20 MED ORDER — DEXAMETHASONE SODIUM PHOSPHATE 10 MG/ML IJ SOLN
INTRAMUSCULAR | Status: AC
  Filled 2016-07-20: qty 1

## 2016-07-20 MED ORDER — HYDROCODONE-ACETAMINOPHEN 5-300 MG PO TABS: 1.0000 | ORAL_TABLET | ORAL | 0 refills | Status: DC | PRN

## 2016-07-20 MED ORDER — FENTANYL CITRATE (PF) 100 MCG/2ML IJ SOLN: 25.0000 ug | INTRAMUSCULAR | Status: DC | PRN

## 2016-07-20 MED ORDER — ACETAMINOPHEN 500 MG PO TABS
ORAL_TABLET | ORAL | Status: AC
  Administered 2016-07-20: 1000 mg via ORAL
  Filled 2016-07-20: qty 2

## 2016-07-20 MED ORDER — ONDANSETRON HCL 4 MG/2ML IJ SOLN
INTRAMUSCULAR | Status: AC
  Administered 2016-07-20: 4 mg via INTRAVENOUS
  Filled 2016-07-20: qty 2

## 2016-07-20 MED ORDER — BUPIVACAINE-EPINEPHRINE (PF) 0.25% -1:200000 IJ SOLN
INTRAMUSCULAR | Status: DC | PRN
  Administered 2016-07-20: 30 mL

## 2016-07-20 MED ORDER — FENTANYL CITRATE (PF) 100 MCG/2ML IJ SOLN
INTRAMUSCULAR | Status: AC
  Filled 2016-07-20: qty 2

## 2016-07-20 MED ORDER — CEFAZOLIN SODIUM-DEXTROSE 2-4 GM/100ML-% IV SOLN
2.0000 g | INTRAVENOUS | Status: AC
  Administered 2016-07-20: 2 g via INTRAVENOUS

## 2016-07-20 MED ORDER — CEFAZOLIN SODIUM-DEXTROSE 2-4 GM/100ML-% IV SOLN
INTRAVENOUS | Status: AC
  Administered 2016-07-20: 2 g via INTRAVENOUS
  Filled 2016-07-20: qty 100

## 2016-07-20 MED ORDER — ONDANSETRON HCL 4 MG/2ML IJ SOLN
INTRAMUSCULAR | Status: DC | PRN
Start: 2016-07-20 — End: 2016-07-20
  Administered 2016-07-20: 4 mg via INTRAVENOUS

## 2016-07-20 MED ORDER — BUPIVACAINE-EPINEPHRINE (PF) 0.25% -1:200000 IJ SOLN
INTRAMUSCULAR | Status: AC
  Filled 2016-07-20: qty 30

## 2016-07-20 SURGICAL SUPPLY — 43 items
ADHESIVE MASTISOL STRL (MISCELLANEOUS) ×3 IMPLANT
APPLIER CLIP ROT 10 11.4 M/L (STAPLE) ×3
BLADE SURG SZ11 CARB STEEL (BLADE) ×3 IMPLANT
CANISTER SUCT 1200ML W/VALVE (MISCELLANEOUS) ×3 IMPLANT
CATH CHOLANGI 4FR 420404F (CATHETERS) ×3 IMPLANT
CHLORAPREP W/TINT 26ML (MISCELLANEOUS) ×3 IMPLANT
CLIP APPLIE ROT 10 11.4 M/L (STAPLE) ×1 IMPLANT
CLOSURE WOUND 1/2 X4 (GAUZE/BANDAGES/DRESSINGS) ×1
CONRAY 60ML FOR OR (MISCELLANEOUS) ×3 IMPLANT
DRAPE C-ARM XRAY 36X54 (DRAPES) ×3 IMPLANT
ELECT REM PT RETURN 9FT ADLT (ELECTROSURGICAL) ×3
ELECTRODE REM PT RTRN 9FT ADLT (ELECTROSURGICAL) ×1 IMPLANT
ENDOPOUCH RETRIEVER 10 (MISCELLANEOUS) ×3 IMPLANT
GAUZE SPONGE NON-WVN 2X2 STRL (MISCELLANEOUS) ×4 IMPLANT
GLOVE BIO SURGEON STRL SZ8 (GLOVE) ×3 IMPLANT
GOWN STRL REUS W/ TWL LRG LVL3 (GOWN DISPOSABLE) ×4 IMPLANT
GOWN STRL REUS W/TWL LRG LVL3 (GOWN DISPOSABLE) ×8
IRRIGATION STRYKERFLOW (MISCELLANEOUS) ×1 IMPLANT
IRRIGATOR STRYKERFLOW (MISCELLANEOUS) ×3
IV CATH ANGIO 12GX3 LT BLUE (NEEDLE) ×3 IMPLANT
IV NS 1000ML (IV SOLUTION) ×2
IV NS 1000ML BAXH (IV SOLUTION) ×1 IMPLANT
JACKSON PRATT 10 (INSTRUMENTS) IMPLANT
KIT RM TURNOVER STRD PROC AR (KITS) ×3 IMPLANT
LABEL OR SOLS (LABEL) ×3 IMPLANT
NDL SAFETY 22GX1.5 (NEEDLE) ×3 IMPLANT
NEEDLE VERESS 14GA 120MM (NEEDLE) ×3 IMPLANT
NS IRRIG 500ML POUR BTL (IV SOLUTION) ×3 IMPLANT
PACK LAP CHOLECYSTECTOMY (MISCELLANEOUS) ×3 IMPLANT
SCISSORS METZENBAUM CVD 33 (INSTRUMENTS) ×3 IMPLANT
SLEEVE ENDOPATH XCEL 5M (ENDOMECHANICALS) ×6 IMPLANT
SPONGE LAP 18X18 5 PK (GAUZE/BANDAGES/DRESSINGS) ×3 IMPLANT
SPONGE VERSALON 2X2 STRL (MISCELLANEOUS) ×8
SPONGE VERSALON 4X4 4PLY (MISCELLANEOUS) ×3 IMPLANT
STRIP CLOSURE SKIN 1/2X4 (GAUZE/BANDAGES/DRESSINGS) ×2 IMPLANT
SUT MNCRL 4-0 (SUTURE) ×2
SUT MNCRL 4-0 27XMFL (SUTURE) ×1
SUT VICRYL 0 AB UR-6 (SUTURE) ×3 IMPLANT
SUTURE MNCRL 4-0 27XMF (SUTURE) ×1 IMPLANT
SYR 20CC LL (SYRINGE) ×3 IMPLANT
TROCAR XCEL NON-BLD 11X100MML (ENDOMECHANICALS) ×3 IMPLANT
TROCAR XCEL NON-BLD 5MMX100MML (ENDOMECHANICALS) ×3 IMPLANT
TUBING INSUFFLATOR HI FLOW (MISCELLANEOUS) ×3 IMPLANT

## 2016-07-20 NOTE — Transfer of Care (Signed)
Immediate Anesthesia Transfer of Care Note  Patient: Marie Griffin FiscalLori Prabhakar  Procedure(s) Performed: Procedure(s): LAPAROSCOPIC CHOLECYSTECTOMY (N/A)  Patient Location: PACU  Anesthesia Type:General  Level of Consciousness: awake, oriented and patient cooperative  Airway & Oxygen Therapy: Patient Spontanous Breathing and Patient connected to face mask oxygen  Post-op Assessment: Report given to RN, Post -op Vital signs reviewed and stable and Patient moving all extremities X 4  Post vital signs: Reviewed and stable  Last Vitals:  Vitals:   07/20/16 1111 07/20/16 1308  BP: (!) 98/56 136/77  Pulse: 75 (!) 101  Resp: 16 11  Temp: 36.7 C 36.5 C    Last Pain:  Vitals:   07/20/16 1111  TempSrc: Oral  PainSc: 0-No pain         Complications: No apparent anesthesia complications

## 2016-07-20 NOTE — Discharge Instructions (Signed)
Remove dressing in 24 hours. °May shower in 24 hours. °Leave paper strips in place. °Resume all home medications. °Follow-up with Dr. Cooper in 10 days. ° °AMBULATORY SURGERY  °DISCHARGE INSTRUCTIONS ° ° °1) The drugs that you were given will stay in your system until tomorrow so for the next 24 hours you should not: ° °A) Drive an automobile °B) Make any legal decisions °C) Drink any alcoholic beverage ° ° °2) You may resume regular meals tomorrow.  Today it is better to start with liquids and gradually work up to solid foods. ° °You may eat anything you prefer, but it is better to start with liquids, then soup and crackers, and gradually work up to solid foods. ° ° °3) Please notify your doctor immediately if you have any unusual bleeding, trouble breathing, redness and pain at the surgery site, drainage, fever, or pain not relieved by medication. ° ° ° °4) Additional Instructions: ° ° ° ° ° ° ° °Please contact your physician with any problems or Same Day Surgery at [phone], Monday through Friday 6 am to 4 pm, or [place] [facility] [place] Main number at [phone]. °

## 2016-07-20 NOTE — Progress Notes (Signed)
Preoperative Review   Patient is met in the preoperative holding area. The history is reviewed in the chart and with the patient. I personally reviewed the options and rationale as well as the risks of this procedure that have been previously discussed with the patient. All questions asked by the patient and/or family were answered to their satisfaction. LFT elevated two d ago without symptoms. Will plan cholangiogram. Risks in detail. Patient agrees to proceed with this procedure at this time.  Florene Glen M.D. FACS

## 2016-07-20 NOTE — Anesthesia Preprocedure Evaluation (Signed)
Anesthesia Evaluation  Patient identified by MRN, date of birth, ID band Patient awake    Reviewed: Allergy & Precautions, Patient's Chart, lab work & pertinent test results  Airway Mallampati: II       Dental  (+) Teeth Intact   Pulmonary neg pulmonary ROS,    breath sounds clear to auscultation       Cardiovascular Exercise Tolerance: Good negative cardio ROS   Rhythm:Regular Rate:Normal     Neuro/Psych negative neurological ROS     GI/Hepatic negative GI ROS, Neg liver ROS,   Endo/Other  Hypothyroidism   Renal/GU negative Renal ROS     Musculoskeletal negative musculoskeletal ROS (+)   Abdominal Normal abdominal exam  (+)   Peds negative pediatric ROS (+)  Hematology  (+) anemia ,   Anesthesia Other Findings   Reproductive/Obstetrics                             Anesthesia Physical Anesthesia Plan  ASA: II  Anesthesia Plan: General   Post-op Pain Management:    Induction: Intravenous  Airway Management Planned: Oral ETT  Additional Equipment:   Intra-op Plan:   Post-operative Plan: Extubation in OR  Informed Consent: I have reviewed the patients History and Physical, chart, labs and discussed the procedure including the risks, benefits and alternatives for the proposed anesthesia with the patient or authorized representative who has indicated his/her understanding and acceptance.     Plan Discussed with: CRNA  Anesthesia Plan Comments:         Anesthesia Quick Evaluation

## 2016-07-20 NOTE — Op Note (Signed)
Laparoscopic Cholecystectomy  Pre-operative Diagnosis: Biliary colic  Post-operative Diagnosis: Biliary colic  Procedure: Laparoscopic cholecystectomy with C-arm fluoroscopic angiography  Surgeon: Adah Salvage. Excell Seltzer, MD FACS  Anesthesia: Gen. with endotracheal tube  Assistant: PA student  Procedure Details  The patient was seen again in the Holding Room. The benefits, complications, treatment options, and expected outcomes were discussed with the patient. The risks of bleeding, infection, recurrence of symptoms, failure to resolve symptoms, bile duct damage, bile duct leak, retained common bile duct stone, bowel injury, any of which could require further surgery and/or ERCP, stent, or papillotomy were reviewed with the patient. The likelihood of improving the patient's symptoms with return to their baseline status is good.  The patient and/or family concurred with the proposed plan, giving informed consent.  The patient was taken to Operating Room, identified as Marie Griffin and the procedure verified as Laparoscopic Cholecystectomy.  A Time Out was held and the above information confirmed.  Prior to the induction of general anesthesia, antibiotic prophylaxis was administered. VTE prophylaxis was in place. General endotracheal anesthesia was then administered and tolerated well. After the induction, the abdomen was prepped with Chloraprep and draped in the sterile fashion. The patient was positioned in the supine position.  Local anesthetic  was injected into the skin near the umbilicus and an incision made. The Veress needle was placed. Pneumoperitoneum was then created with CO2 and tolerated well without any adverse changes in the patient's vital signs. A 5mm port was placed in the periumbilical position and the abdominal cavity was explored.  Two 5-mm ports were placed in the right upper quadrant and a 12 mm epigastric port was placed all under direct vision. All skin incisions  were infiltrated  with a local anesthetic agent before making the incision and placing the trocars.   The patient was positioned  in reverse Trendelenburg, tilted slightly to the patient's left.  The gallbladder was identified, the fundus grasped and retracted cephalad. Adhesions were lysed bluntly. The infundibulum was grasped and retracted laterally, exposing the peritoneum overlying the triangle of Calot. This was then divided and exposed in a blunt fashion. A critical view of the cystic duct and cystic artery was obtained.  The cystic duct was clearly identified and bluntly dissected.   Cystic lymphatics were clipped and divided. This allowed for good visualization the cystic duct as it entered the infundibulum of the gallbladder was clipped and incised and through a separate incision and Angiocath a cholangiocatheter was placed. Initial fluoroscopic clinic gram was performed that failed to identify the proximal ducts. It was believed that the cystic duct had been cannulated properly therefore the patient was placed in steep Trendelenburg position and ultimately there was both good flow into the duodenum without intraluminal filling defects and the proximal ducts became evident even though they were ghosting as the contrast filled the proximal ducts and and emptied promptly into the duodenum. It was clear that the cystic duct had been properly cannulated.  The cystic duct catheter was removed and the cystic duct was doubly clipped and divided. The cystic artery was doubly clipped and divided.  The gallbladder was taken from the gallbladder fossa in a retrograde fashion with the electrocautery. The gallbladder was removed and placed in an Endocatch bag. The liver bed was irrigated and inspected. Hemostasis was achieved with the electrocautery. Copious irrigation was utilized and was repeatedly aspirated until clear.  The gallbladder and Endocatch sac were then removed through the epigastric port site.   Inspection of  the  right upper quadrant was performed. No bleeding, bile duct injury or leak, or bowel injury was noted. Pneumoperitoneum was released.  The epigastric port site was closed with figure-of-eight 0 Vicryl sutures. 4-0 subcuticular Monocryl was used to close the skin. Steristrips and Mastisol and sterile dressings were  applied.  The patient was then extubated and brought to the recovery room in stable condition. Sponge, lap, and needle counts were correct at closure and at the conclusion of the case.   Findings: Chronic Cholecystitis   Estimated Blood Loss: Minimal         Drains: None         Specimens: Gallbladder           Complications: none               Richard E. Excell Seltzerooper, MD, FACS

## 2016-07-20 NOTE — Anesthesia Procedure Notes (Signed)
Procedure Name: Intubation Date/Time: 07/20/2016 12:10 PM Performed by: Silvana Newness Pre-anesthesia Checklist: Patient identified, Emergency Drugs available, Suction available, Patient being monitored and Timeout performed Patient Re-evaluated:Patient Re-evaluated prior to inductionOxygen Delivery Method: Circle system utilized Preoxygenation: Pre-oxygenation with 100% oxygen Intubation Type: IV induction Ventilation: Mask ventilation without difficulty Laryngoscope Size: Mac and 3 Grade View: Grade I Tube type: Oral Tube size: 7.0 mm Number of attempts: 1 Airway Equipment and Method: Stylet Placement Confirmation: ETT inserted through vocal cords under direct vision,  positive ETCO2 and breath sounds checked- equal and bilateral Secured at: 18 cm Tube secured with: Tape Dental Injury: Teeth and Oropharynx as per pre-operative assessment

## 2016-07-23 LAB — SURGICAL PATHOLOGY

## 2016-07-25 ENCOUNTER — Telehealth: Payer: Self-pay

## 2016-07-25 ENCOUNTER — Encounter: Payer: Self-pay | Admitting: Surgery

## 2016-07-25 NOTE — Anesthesia Postprocedure Evaluation (Signed)
Anesthesia Post Note  Patient: Marie Griffin  Procedure(s) Performed: Procedure(s) (LRB): LAPAROSCOPIC CHOLECYSTECTOMY (N/A)  Patient location during evaluation: PACU Anesthesia Type: General Level of consciousness: awake Pain management: pain level controlled Vital Signs Assessment: post-procedure vital signs reviewed and stable Respiratory status: spontaneous breathing Cardiovascular status: stable Anesthetic complications: no     Last Vitals:  Vitals:   07/20/16 1410 07/20/16 1441  BP: 113/71 109/64  Pulse: 73   Resp: 16 16  Temp:      Last Pain:  Vitals:   07/20/16 1353  TempSrc:   PainSc: 4                  VAN STAVEREN,Larena Ohnemus

## 2016-07-25 NOTE — Telephone Encounter (Signed)
Patient's disability form from [employer] was filled out and faxed. 

## 2016-07-26 NOTE — Telephone Encounter (Signed)
Disability Paperwork has been received and a $25.00 collection fee has been obtained.  

## 2016-07-30 ENCOUNTER — Other Ambulatory Visit: Payer: Self-pay

## 2016-07-31 ENCOUNTER — Ambulatory Visit (INDEPENDENT_AMBULATORY_CARE_PROVIDER_SITE_OTHER): Payer: 59 | Admitting: Surgery

## 2016-07-31 ENCOUNTER — Encounter: Payer: Self-pay | Admitting: Surgery

## 2016-07-31 VITALS — BP 117/78 | HR 80 | Temp 98.4°F | Ht 63.0 in | Wt 123.2 lb

## 2016-07-31 DIAGNOSIS — K8050 Calculus of bile duct without cholangitis or cholecystitis without obstruction: Secondary | ICD-10-CM

## 2016-07-31 NOTE — Patient Instructions (Signed)
You may return to work as scheduled. Please see your note provided.  Please call our office with any questions or concerns.  Please do not submerge in a tub, hot tub, or pool until incisions are completely sealed.  Use sun block to incision area over the next year if this area will be exposed to sun. This helps decrease scarring.  You may resume your normal activities on 08/31/16. At that time- Listen to your body when lifting, if you have pain when lifting, stop and then try again in a few days. Pain after doing exercises or activities of daily living is normal as you get back in to your normal routine.  If you develop redness, drainage, or pain at incision sites- call our office immediately and speak with a nurse.

## 2016-07-31 NOTE — Progress Notes (Signed)
Outpatient postop visit  07/31/2016  Marie Griffin is an 56 y.o. female.    Procedure: Laparoscopic cholecystectomy with grams  CC:" kNot in incision" * HPI: This patient status post laparoscopic cholecystectomy with cholangiography she feels well but says she has a knot in her incision. On questioning she means swelling not a suture She has no nausea vomiting no fevers or chills and feels well wants to go back to work on Monday  Medications reviewed.    Physical Exam:  BP 117/78   Pulse 80   Temp 98.4 F (36.9 C) (Oral)   Ht 5\' 3"  (1.6 m)   Wt 123 lb 3.2 oz (55.9 kg)   BMI 21.82 kg/m     PE: Wounds are clean no erythema no drainage minimal ecchymosis around one of the lateral port sites no extruded suture is noted there is swelling at the epigastric site which is normal postoperative swelling.    Assessment/Plan:  Pathology is reviewed. No sign of malignancy. Patient doing very well recommend follow up on an as-needed basis reminded of no heavy lifting for another 2 weeks.  Lattie Hawichard E Kelli Egolf, MD, FACS

## 2016-11-12 ENCOUNTER — Other Ambulatory Visit: Payer: Self-pay | Admitting: Obstetrics and Gynecology

## 2016-11-12 DIAGNOSIS — Z1231 Encounter for screening mammogram for malignant neoplasm of breast: Secondary | ICD-10-CM

## 2016-11-28 ENCOUNTER — Ambulatory Visit: Payer: 59

## 2016-12-17 ENCOUNTER — Ambulatory Visit
Admission: RE | Admit: 2016-12-17 | Discharge: 2016-12-17 | Disposition: A | Discharge status: Home / Self Care | Payer: 59 | Source: Ambulatory Visit | Attending: Obstetrics and Gynecology | Admitting: Obstetrics and Gynecology

## 2016-12-17 DIAGNOSIS — Z1231 Encounter for screening mammogram for malignant neoplasm of breast: Secondary | ICD-10-CM

## 2017-07-01 LAB — HM COLONOSCOPY

## 2017-12-25 ENCOUNTER — Other Ambulatory Visit: Payer: Self-pay | Admitting: Obstetrics and Gynecology

## 2017-12-25 DIAGNOSIS — Z1231 Encounter for screening mammogram for malignant neoplasm of breast: Secondary | ICD-10-CM

## 2018-01-01 LAB — HM PAP SMEAR: HM Pap smear: NORMAL

## 2018-01-13 ENCOUNTER — Ambulatory Visit
Admission: RE | Admit: 2018-01-13 | Discharge: 2018-01-13 | Disposition: A | Discharge status: Home / Self Care | Payer: Managed Care, Other (non HMO) | Source: Ambulatory Visit | Attending: Obstetrics and Gynecology | Admitting: Obstetrics and Gynecology

## 2018-01-13 DIAGNOSIS — Z1231 Encounter for screening mammogram for malignant neoplasm of breast: Secondary | ICD-10-CM | POA: Diagnosis not present

## 2018-01-15 ENCOUNTER — Other Ambulatory Visit: Payer: Self-pay | Admitting: Obstetrics and Gynecology

## 2018-01-15 DIAGNOSIS — R928 Other abnormal and inconclusive findings on diagnostic imaging of breast: Secondary | ICD-10-CM

## 2018-01-22 ENCOUNTER — Ambulatory Visit
Admission: RE | Admit: 2018-01-22 | Discharge: 2018-01-22 | Disposition: A | Discharge status: Home / Self Care | Payer: Managed Care, Other (non HMO) | Source: Ambulatory Visit | Attending: Obstetrics and Gynecology | Admitting: Obstetrics and Gynecology

## 2018-01-22 DIAGNOSIS — R928 Other abnormal and inconclusive findings on diagnostic imaging of breast: Secondary | ICD-10-CM

## 2018-01-23 ENCOUNTER — Other Ambulatory Visit: Payer: Self-pay | Admitting: Obstetrics and Gynecology

## 2018-01-23 DIAGNOSIS — R928 Other abnormal and inconclusive findings on diagnostic imaging of breast: Secondary | ICD-10-CM

## 2018-01-29 ENCOUNTER — Ambulatory Visit
Admission: RE | Admit: 2018-01-29 | Discharge: 2018-01-29 | Disposition: A | Discharge status: Home / Self Care | Payer: Managed Care, Other (non HMO) | Source: Ambulatory Visit | Attending: Obstetrics and Gynecology | Admitting: Obstetrics and Gynecology

## 2018-01-29 DIAGNOSIS — R928 Other abnormal and inconclusive findings on diagnostic imaging of breast: Secondary | ICD-10-CM | POA: Insufficient documentation

## 2018-01-29 HISTORY — PX: BREAST BIOPSY: SHX20

## 2018-01-30 LAB — SURGICAL PATHOLOGY

## 2018-02-27 ENCOUNTER — Other Ambulatory Visit: Payer: Self-pay | Admitting: Obstetrics and Gynecology

## 2018-02-27 DIAGNOSIS — N6099 Unspecified benign mammary dysplasia of unspecified breast: Secondary | ICD-10-CM

## 2018-06-30 ENCOUNTER — Ambulatory Visit
Admission: RE | Admit: 2018-06-30 | Discharge: 2018-06-30 | Disposition: A | Discharge status: Home / Self Care | Payer: Managed Care, Other (non HMO) | Source: Ambulatory Visit | Attending: Obstetrics and Gynecology | Admitting: Obstetrics and Gynecology

## 2018-06-30 DIAGNOSIS — N6099 Unspecified benign mammary dysplasia of unspecified breast: Secondary | ICD-10-CM | POA: Insufficient documentation

## 2019-01-29 ENCOUNTER — Telehealth: Payer: Managed Care, Other (non HMO) | Admitting: Family

## 2019-01-29 DIAGNOSIS — Z719 Counseling, unspecified: Secondary | ICD-10-CM

## 2019-01-29 DIAGNOSIS — J069 Acute upper respiratory infection, unspecified: Secondary | ICD-10-CM

## 2019-01-29 MED ORDER — FLUTICASONE PROPIONATE 50 MCG/ACT NA SUSP: 1.0000 | Freq: Two times a day (BID) | NASAL | 6 refills | Status: DC

## 2019-01-29 MED ORDER — BENZONATATE 100 MG PO CAPS: 100.0000 mg | ORAL_CAPSULE | Freq: Three times a day (TID) | ORAL | 0 refills | Status: DC | PRN

## 2019-01-29 NOTE — Progress Notes (Signed)
Greater than 5 minutes, yet less than 10 minutes of time have been spent researching, coordinating, and implementing care for this patient today.  Thank you for the details you included in the comment boxes. Those details are very helpful in determining the best course of treatment for you and help [place] to provide the best care.  Providers prescribe antibiotics to treat infections caused by bacteria. Antibiotics are very powerful in treating bacterial infections when they are used properly. To maintain their effectiveness, they should be used only when necessary. Overuse of antibiotics has resulted in the development of superbugs that are resistant to treatment!    After careful review of your answers, I would not recommend an antibiotic for your condition.  Antibiotics are not effective against viruses and therefore should not be used to treat them. Common examples of infections caused by viruses include colds and flu   ---------------------------------------------------------------------------------  We are sorry you are not feeling well.  Here is how we plan to help!  Based on what you have shared with me, it looks like you may have a viral upper respiratory infection.  Upper respiratory infections are caused by a large number of viruses; however, rhinovirus is the most common cause.   Symptoms vary from person to person, with common symptoms including sore throat, cough, and fatigue or lack of energy.  A low-grade fever of up to 100.4 may present, but is often uncommon.  Symptoms vary however, and are closely related to a person's age or underlying illnesses.  The most common symptoms associated with an upper respiratory infection are nasal discharge or congestion, cough, sneezing, headache and pressure in the ears and face.  These symptoms usually persist for about 3 to 10 days, but can last up to 2 weeks.  It is important to know that upper respiratory infections do not cause serious illness or  complications in most cases.    Upper respiratory infections can be transmitted from person to person, with the most common method of transmission being a person's hands.  The virus is able to live on the skin and can infect other persons for up to 2 hours after direct contact.  Also, these can be transmitted when someone coughs or sneezes; thus, it is important to cover the mouth to reduce this risk.  To keep the spread of the illness at [place], good hand hygiene is very important.  This is an infection that is most likely caused by a virus. There are no specific treatments other than to help you with the symptoms until the infection runs its course.  We are sorry you are not feeling well.  Here is how we plan to help!   For nasal congestion, you may use an oral decongestants such as Mucinex D or if you have glaucoma or high blood pressure use plain Mucinex.  Saline nasal spray or nasal drops can help and can safely be used as often as needed for congestion.  For your congestion, I have prescribed Fluticasone nasal spray one spray in each nostril twice a day  If you do not have a history of heart disease, hypertension, diabetes or thyroid disease, prostate/bladder issues or glaucoma, you may also use Sudafed to treat nasal congestion.  It is highly recommended that you consult with a pharmacist or your primary care physician to ensure this medication is safe for you to take.     If you have a cough, you may use cough suppressants such as Delsym and Robitussin.  If you have glaucoma or high blood pressure, you can also use Coricidin HBP.   For cough I have prescribed for you A prescription cough medication called Tessalon Perles 100 mg. You may take 1-2 capsules every 8 hours as needed for cough  If you have a sore or scratchy throat, use a saltwater gargle-  to  teaspoon of salt dissolved in a 4-ounce to 8-ounce glass of warm water.  Gargle the solution for approximately 15-30 seconds and then spit.   It is important not to swallow the solution.  You can also use throat lozenges/cough drops and Chloraseptic spray to help with throat pain or discomfort.  Warm or cold liquids can also be helpful in relieving throat pain.  For headache, pain or general discomfort, you can use Ibuprofen or Tylenol as directed.   Some authorities believe that zinc sprays or the use of Echinacea may shorten the course of your symptoms.   HOME CARE . Only take medications as instructed by your medical team. . Be sure to drink plenty of fluids. Water is fine as well as fruit juices, sodas and electrolyte beverages. You may want to stay away from caffeine or alcohol. If you are nauseated, try taking small sips of liquids. How do you know if you are getting enough fluid? Your urine should be a pale yellow or almost colorless. . Get rest. . Taking a steamy shower or using a humidifier may help nasal congestion and ease sore throat pain. You can place a towel over your head and breathe in the steam from hot water coming from a faucet. . Using a saline nasal spray works much the same way. . Cough drops, hard candies and sore throat lozenges may ease your cough. . Avoid close contacts especially the very young and the elderly . Cover your mouth if you cough or sneeze . Always remember to wash your hands.   GET HELP RIGHT AWAY IF: . You develop worsening fever. . If your symptoms do not improve within 10 days . You develop yellow or green discharge from your nose over 3 days. . You have coughing fits . You develop a severe head ache or visual changes. . You develop shortness of breath, difficulty breathing or start having chest pain . Your symptoms persist after you have completed your treatment plan  MAKE SURE YOU   Understand these instructions.  Will watch your condition.  Will get help right away if you are not doing well or get worse.  Your e-visit answers were reviewed by a board certified advanced  clinical practitioner to complete your personal care plan. Depending upon the condition, your plan could have included both over the counter or prescription medications. Please review your pharmacy choice. If there is a problem, you may call our nursing hot line at and have the prescription routed to another pharmacy. Your safety is important to us. If you have drug allergies check your prescription carefully.   You can use MyChart to ask questions about today's visit, request a non-urgent call back, or ask for a work or school excuse for 24 hours related to this e-Visit. If it has been greater than 24 hours you will need to follow up with your provider, or enter a new e-Visit to address those concerns. You will get an e-mail in the next two days asking about your experience.  I hope that your e-visit has been valuable and will speed your recovery. Thank you for using e-visits.

## 2019-01-29 NOTE — Progress Notes (Signed)
DUPLICATE; DO NOT CHARGE PATIENT.

## 2019-01-30 ENCOUNTER — Other Ambulatory Visit: Payer: Self-pay

## 2019-01-30 DIAGNOSIS — Z20822 Contact with and (suspected) exposure to covid-19: Secondary | ICD-10-CM

## 2019-02-03 LAB — NOVEL CORONAVIRUS, NAA: SARS-CoV-2, NAA: NOT DETECTED

## 2020-07-15 IMAGING — MG DIGITAL DIAGNOSTIC UNILATERAL LEFT MAMMOGRAM WITH TOMO AND CAD
4 series · 4 of 12 positions shown · non-contrast
Comparison: Previous exam(s).

CLINICAL DATA: Patient presents for a diagnostic left breast
examination. Patient had left breast stereotactic core needle biopsy
of 2 sites January 2018 with the more posterior site demonstrating ALH
and fibrosis and the more anterior site demonstrating fibrosis. The
more anterior site was thought to be discordant and surgical
excision was recommended for both sites. Patient declined surgical
excision.

EXAM:
DIGITAL DIAGNOSTIC UNILATERAL LEFT MAMMOGRAM WITH CAD AND TOMO

[L MLO synth-2D]
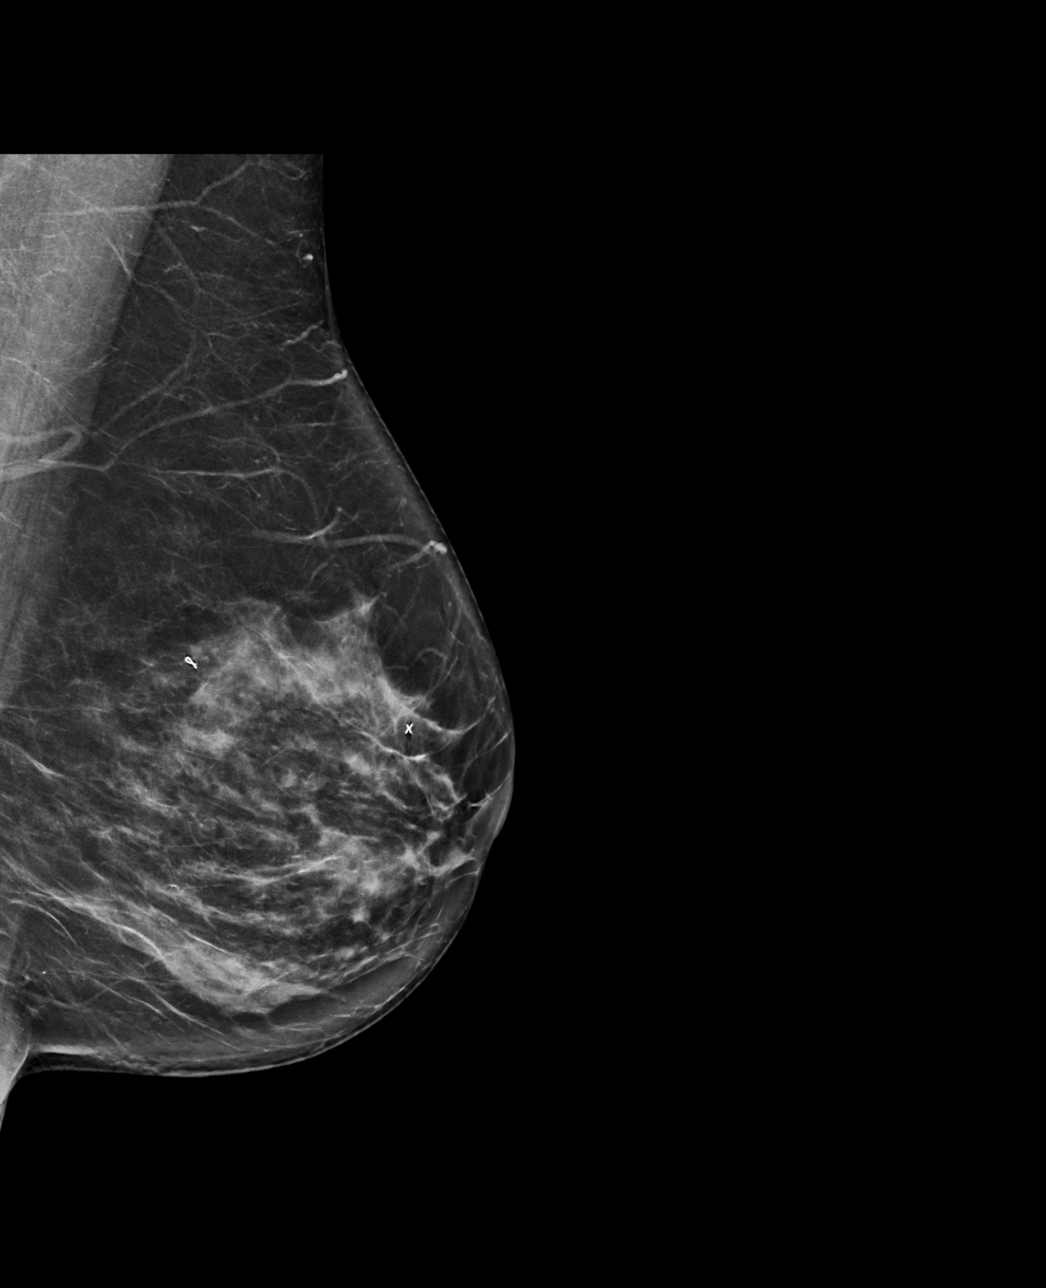

[L CC synth-2D]
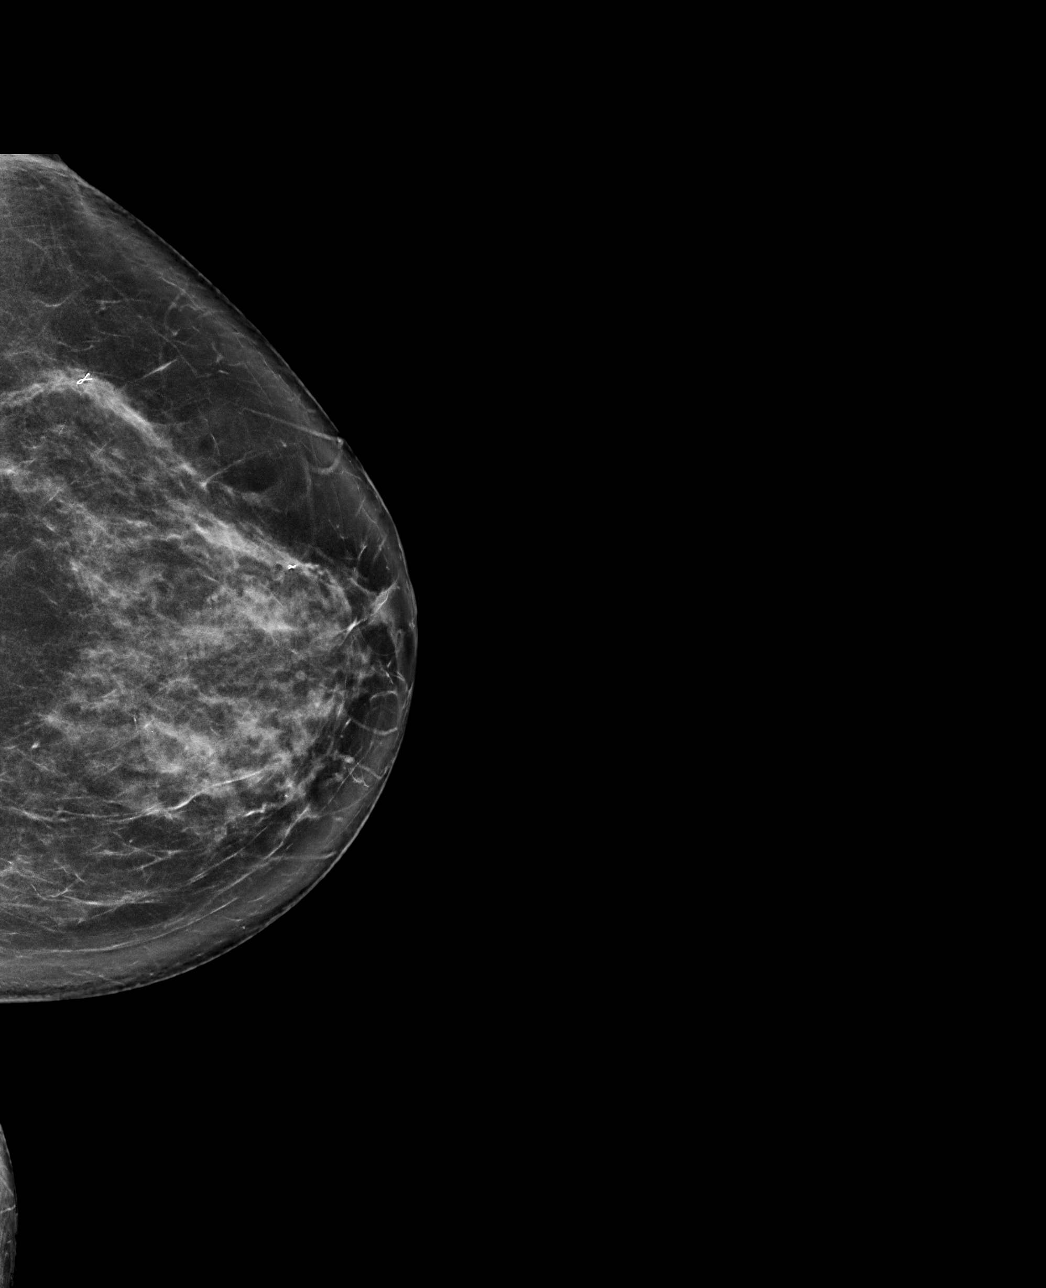

[L MLO tomo · tomo slice 39/77.0]
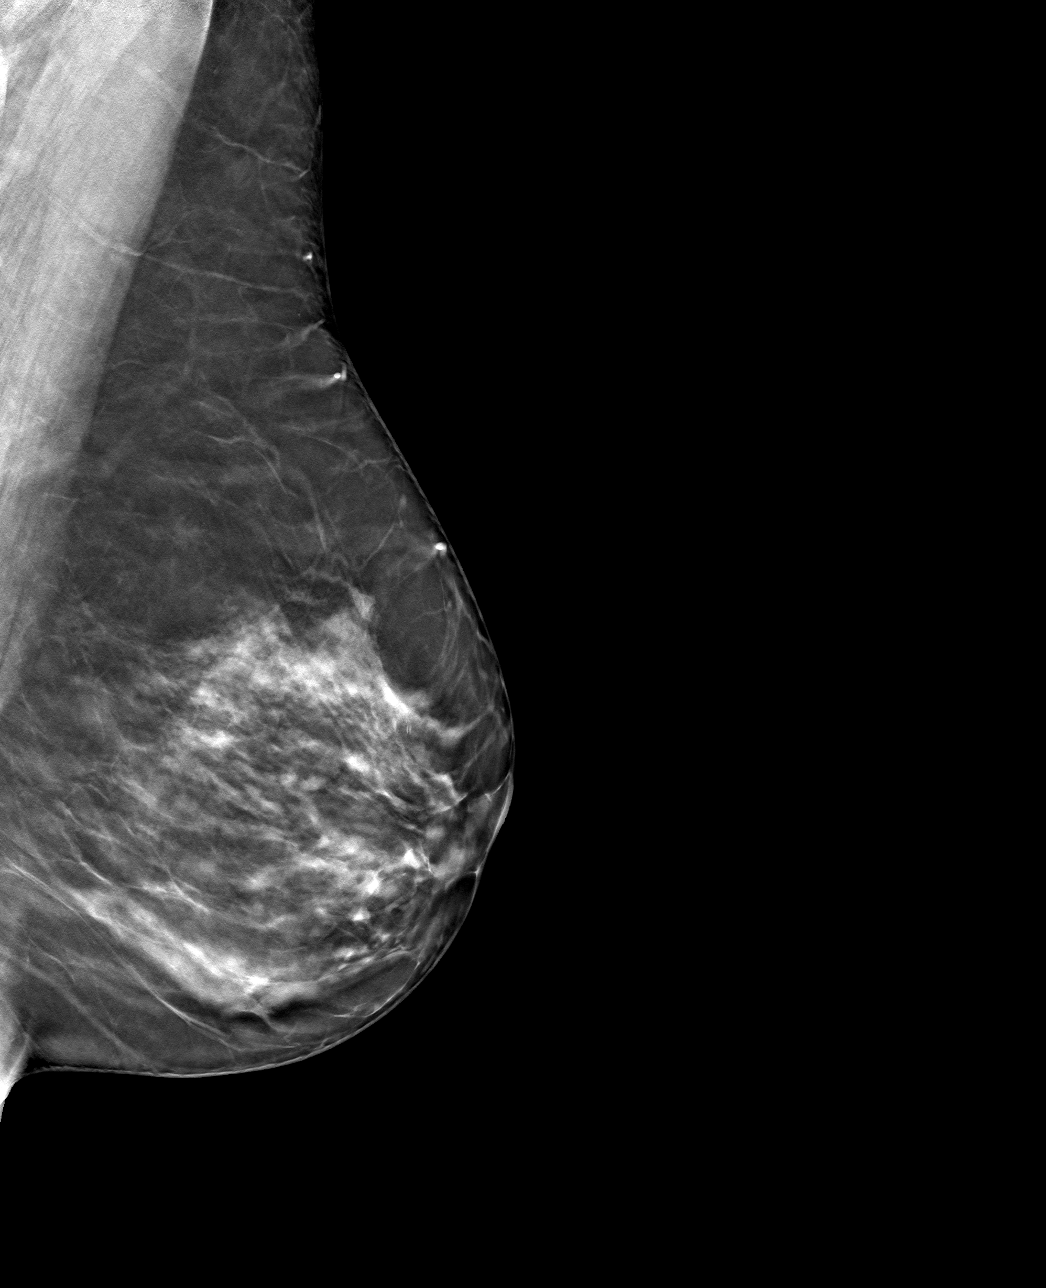

[L CC tomo · tomo slice 39/78.0]
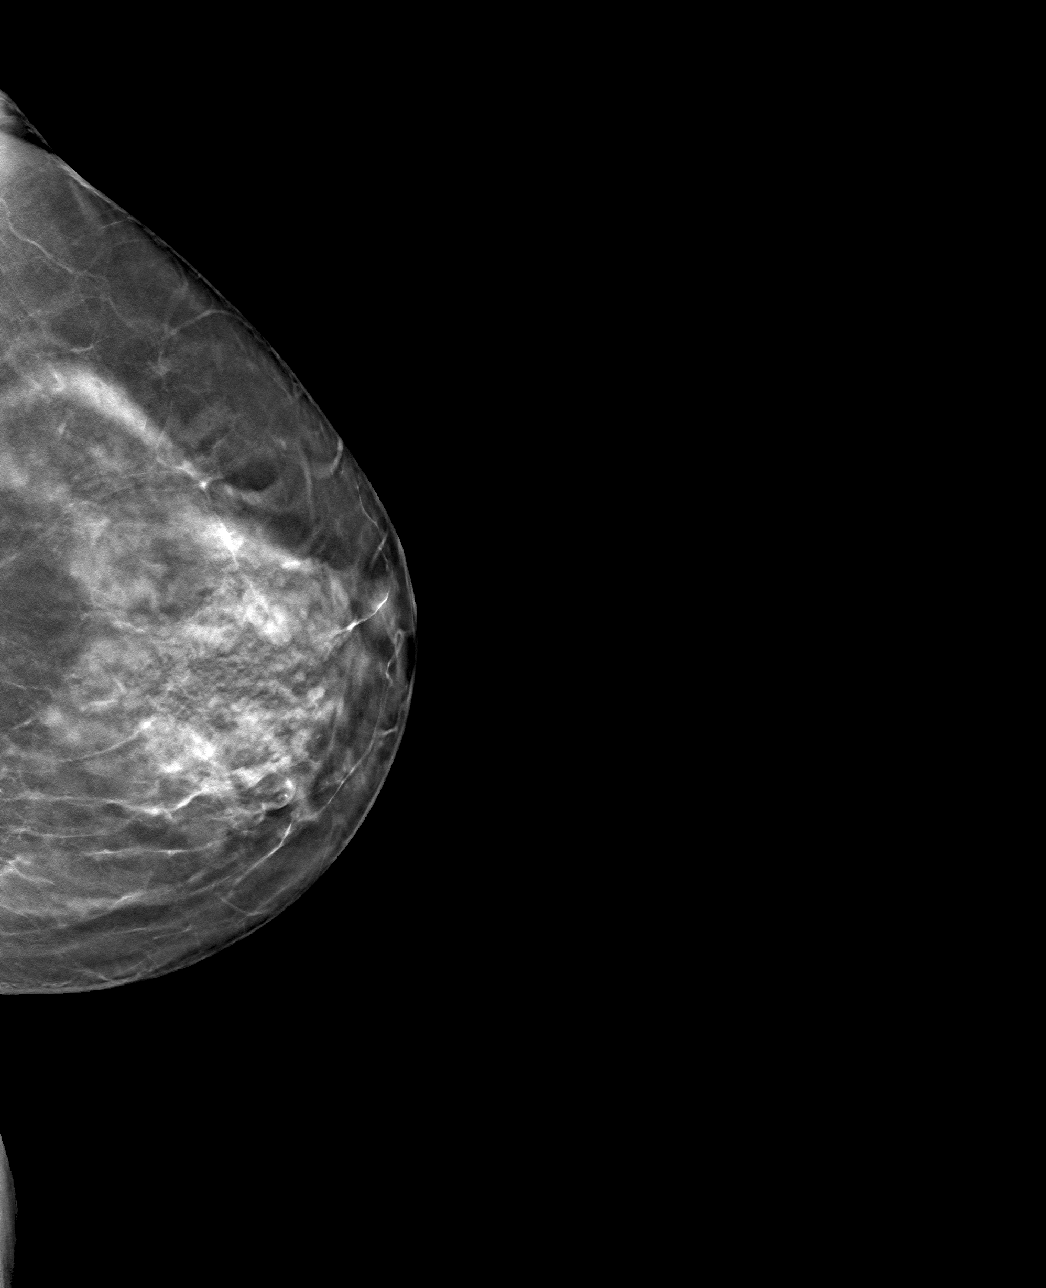

[4 of 12 positions shown; findings below may reference images not displayed]

ACR Breast Density Category c: The breast tissue is heterogeneously
dense, which may obscure small masses.
FINDINGS: Examination demonstrates ribbon shaped biopsy clip over the upper
outer left breast posteriorly and X shaped biopsy clip over the
upper outer left breast anteriorly. There is very subtle distortion
over the outer left breast adjacent the X shaped biopsy clip which
is slightly less prominent. Remainder of the exam is unchanged.

Mammographic images were processed with CAD.
IMPRESSION: Post biopsy changes as described with persistent subtle stable one
view distortion over the outer left breast anteriorly adjacent the X
shaped metallic clip. Note that surgical excision was recommended of
both biopsy sites, although patient declined excision.

RECOMMENDATION:
Continue to recommend consideration of surgical excision of both
sites. Otherwise, recommend continued annual bilateral screening
mammographic follow-up.

I have discussed the findings and recommendations with the patient.
Results were also provided in writing at the conclusion of the
visit. If applicable, a reminder letter will be sent to the patient
regarding the next appointment.

BI-RADS CATEGORY  2: Benign.

## 2021-07-13 ENCOUNTER — Encounter: Payer: Self-pay | Admitting: Internal Medicine

## 2021-07-13 ENCOUNTER — Ambulatory Visit (INDEPENDENT_AMBULATORY_CARE_PROVIDER_SITE_OTHER): Payer: 59 | Admitting: Internal Medicine

## 2021-07-13 VITALS — BP 118/70 | HR 92 | Temp 97.7°F | Resp 16 | Ht 63.0 in | Wt 132.6 lb

## 2021-07-13 DIAGNOSIS — Z13220 Encounter for screening for lipoid disorders: Secondary | ICD-10-CM

## 2021-07-13 DIAGNOSIS — Z114 Encounter for screening for human immunodeficiency virus [HIV]: Secondary | ICD-10-CM

## 2021-07-13 DIAGNOSIS — Z1159 Encounter for screening for other viral diseases: Secondary | ICD-10-CM

## 2021-07-13 DIAGNOSIS — E039 Hypothyroidism, unspecified: Secondary | ICD-10-CM

## 2021-07-13 DIAGNOSIS — D649 Anemia, unspecified: Secondary | ICD-10-CM | POA: Diagnosis not present

## 2021-07-13 DIAGNOSIS — Z87440 Personal history of urinary (tract) infections: Secondary | ICD-10-CM

## 2021-07-13 DIAGNOSIS — Z8639 Personal history of other endocrine, nutritional and metabolic disease: Secondary | ICD-10-CM | POA: Diagnosis not present

## 2021-07-13 DIAGNOSIS — Z1231 Encounter for screening mammogram for malignant neoplasm of breast: Secondary | ICD-10-CM

## 2021-07-13 MED ORDER — NITROFURANTOIN MACROCRYSTAL 100 MG PO CAPS: 100.0000 mg | ORAL_CAPSULE | Freq: Two times a day (BID) | ORAL | 0 refills | Status: DC | PRN

## 2021-07-13 NOTE — Patient Instructions (Addendum)
It was great seeing you today!  Plan discussed at today's visit: -Blood work ordered today, results will be uploaded to MyChart.  -Will send in refills of thyroid medication based on lab results -Antibiotic sent to pharmacy for recurrent UTIs -Mammogram ordered  Follow up in: 3-6 months  Take care and let us know if you have any questions or concerns prior to your next visit.  Dr. Caralee Ates

## 2021-07-13 NOTE — Progress Notes (Signed)
New Patient Office Visit  Subjective:  Patient ID: Marie Griffin, female    DOB: 31-Dec-1960  Age: 60 y.o. MRN: [id number]  CC:  Chief Complaint  Patient presents with   Establish Care   Hypothyroidism    HPI Marie Griffin presents as a new patient.   Hypothyroidism: -Medications: Levothyroxine 25 mcg 1.5 tablets a day, Cytomel 25 mg 0.5 tablets -Patient is compliant with the above medication (s) at the above dose and reports no medication side effects.  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, familial hypercalcemia  -Last calcium 11.8  Hx of Anemia: -Doesn't take iron consistently -No bleeding issues  Recurrent UTIs: -Last UTI in October, gets about 4-5 a year   Hair Loss: -Curently taking Finasteride, seeing Dermatology   Health Maintenance: -Blood work due -Mammogram due, history of biopsy 2019 left breast -Pap 2019, negative, no history of abnormal Paps, will follow with gyn -Colon cancer screening: 3-4 years, repeat in 10 years    Past Medical History:  Diagnosis Date   Anemia    H/O   H/O hypercalcemia    Hypothyroid     Past Surgical History:  Procedure Laterality Date   BREAST BIOPSY Left 01/29/2018   2 AREAS- ALH   BREAST CYST ASPIRATION Right 2009   neg   CESAREAN SECTION  1992 and 1994   x2   CHOLECYSTECTOMY N/A 07/20/2016   Procedure: LAPAROSCOPIC CHOLECYSTECTOMY;  Surgeon: Lattie Haw, MD;  Location: [facility] ORS;  Service: General;  Laterality: N/A;   DILATION AND CURETTAGE OF UTERUS  1990    Family History  Problem Relation Age of Onset   Diabetes Mother    Heart disease Mother    Heart disease Father    Alzheimer's disease Father    Breast cancer Neg Hx     Social History   Socioeconomic History   Marital status: Married    Spouse name: Not on file    Number of children: Not on file   Years of education: Not on file   Highest education level: Not on file  Occupational History   Not on file  Tobacco Use   Smoking status: Never   Smokeless tobacco: Never  Vaping Use   Vaping Use: Never used  Substance and Sexual Activity   Alcohol use: Yes    Comment: Occasional   Drug use: No   Sexual activity: Not Currently  Other Topics Concern   Not on file  Social History Narrative   Not on file   Social Determinants of Health   Financial Resource Strain: Not on file  Food Insecurity: Not on file  Transportation Needs: Not on file  Physical Activity: Not on file  Stress: Not on file  Social Connections: Not on file  Intimate Partner Violence: Not on file    ROS Review of Systems  Constitutional:  Negative for chills and fever.  Eyes:  Negative for visual disturbance.  Respiratory:  Negative for cough and shortness of breath.   Cardiovascular:  Negative for chest pain.  Gastrointestinal:  Negative for abdominal pain, nausea and vomiting.  Neurological:  Negative for dizziness and headaches.   Objective:   Today's Vitals: BP 118/70    Pulse 92    Temp 97.7 F (36.5 C)    Resp 16    Ht 5\' 3"  (1.6 m)  Wt 132 lb 9.6 oz (60.1 kg)    SpO2 99%    BMI 23.49 kg/m   Physical Exam Constitutional:      Appearance: Normal appearance.  HENT:     Head: Normocephalic and atraumatic.  Eyes:     Conjunctiva/sclera: Conjunctivae normal.  Cardiovascular:     Rate and Rhythm: Normal rate and regular rhythm.  Pulmonary:     Effort: Pulmonary effort is normal.     Breath sounds: Normal breath sounds.  Musculoskeletal:     Right lower leg: No edema.     Left lower leg: No edema.  Skin:    General: Skin is warm and dry.  Neurological:     General: No focal deficit present.     Mental Status: She is alert. Mental status is at baseline.  Psychiatric:        Mood and Affect: Mood normal.        Behavior: Behavior normal.     Assessment & Plan:   1. Hypothyroidism, unspecified type: Recheck thyroid labs prior to refilling medications.   - Comprehensive Metabolic Panel (CMET) - 99991111  2. History of hypercalcemia: Recheck CMP.   - Comprehensive Metabolic Panel (CMET)  3. Anemia, unspecified type: Recheck CBC. - CBC w/Diff/Platelet  4. History of recurrent UTIs: Refill antibiotic to have on hand for recurrent UTIs, no symptoms now.   - nitrofurantoin (MACRODANTIN) 100 MG capsule; Take 1 capsule (100 mg total) by mouth 2 (two) times daily as needed (UTI).  Dispense: 30 capsule; Refill: 0  5. Lipid screening/Need for hepatitis C screening test/Encounter for screening for HIV: Lipid, HIV and Hepatitis C screening with above labs.   - Hepatitis C Antibody - Lipid Profile - HIV antibody (with reflex)  6. Encounter for screening mammogram for malignant neoplasm of breast: Mammogram ordered.   - MM Digital Screening; Future  Follow-up: Return in about 6 months (around 01/11/2022).   Teodora Medici, DO

## 2021-07-15 LAB — CBC WITH DIFFERENTIAL/PLATELET
Basophils Absolute: 0.1 10*3/uL (ref 0.0–0.2)
Basos: 2 %
EOS (ABSOLUTE): 0.1 10*3/uL (ref 0.0–0.4)
Eos: 3 %
Hematocrit: 38.1 % (ref 34.0–46.6)
Hemoglobin: 12.8 g/dL (ref 11.1–15.9)
Immature Grans (Abs): 0 10*3/uL (ref 0.0–0.1)
Immature Granulocytes: 0 %
Lymphocytes Absolute: 1 10*3/uL (ref 0.7–3.1)
Lymphs: 29 %
MCH: 30.1 pg (ref 26.6–33.0)
MCHC: 33.6 g/dL (ref 31.5–35.7)
MCV: 90 fL (ref 79–97)
Monocytes Absolute: 0.3 10*3/uL (ref 0.1–0.9)
Monocytes: 8 %
Neutrophils Absolute: 2 10*3/uL (ref 1.4–7.0)
Neutrophils: 58 %
Platelets: 224 10*3/uL (ref 150–450)
RBC: 4.25 x10E6/uL (ref 3.77–5.28)
RDW: 12 % (ref 11.7–15.4)
WBC: 3.4 10*3/uL (ref 3.4–10.8)

## 2021-07-15 LAB — COMPREHENSIVE METABOLIC PANEL
ALT: 14 IU/L (ref 0–32)
AST: 18 IU/L (ref 0–40)
Albumin/Globulin Ratio: 2 (ref 1.2–2.2)
Albumin: 4.6 g/dL (ref 3.8–4.9)
Alkaline Phosphatase: 61 IU/L (ref 44–121)
BUN/Creatinine Ratio: 14 (ref 9–23)
BUN: 10 mg/dL (ref 6–24)
Bilirubin Total: 0.4 mg/dL (ref 0.0–1.2)
CO2: 23 mmol/L (ref 20–29)
Calcium: 11.6 mg/dL — ABNORMAL HIGH (ref 8.7–10.2)
Chloride: 105 mmol/L (ref 96–106)
Creatinine, Ser: 0.71 mg/dL (ref 0.57–1.00)
Globulin, Total: 2.3 g/dL (ref 1.5–4.5)
Glucose: 81 mg/dL (ref 70–99)
Potassium: 4.4 mmol/L (ref 3.5–5.2)
Sodium: 140 mmol/L (ref 134–144)
Total Protein: 6.9 g/dL (ref 6.0–8.5)
eGFR: 98 mL/min/{1.73_m2} (ref >59)

## 2021-07-15 LAB — LIPID PANEL
Chol/HDL Ratio: 2.4 ratio (ref 0.0–4.4)
Cholesterol, Total: 203 mg/dL — ABNORMAL HIGH (ref 100–199)
HDL: 84 mg/dL (ref >39)
LDL Chol Calc (NIH): 110 mg/dL — ABNORMAL HIGH (ref 0–99)
Triglycerides: 49 mg/dL (ref 0–149)
VLDL Cholesterol Cal: 9 mg/dL (ref 5–40)

## 2021-07-15 LAB — TSH+T4F+T3FREE
Free T4: 1.04 ng/dL (ref 0.82–1.77)
T3, Free: 3.3 pg/mL (ref 2.0–4.4)
TSH: 2 u[IU]/mL (ref 0.450–4.500)

## 2021-07-15 LAB — HEPATITIS C ANTIBODY: Hep C Virus Ab: 0.1 s/co ratio (ref 0.0–0.9)

## 2021-07-15 LAB — HIV ANTIBODY (ROUTINE TESTING W REFLEX): HIV Screen 4th Generation wRfx: NONREACTIVE

## 2021-07-18 MED ORDER — LIOTHYRONINE SODIUM 25 MCG PO TABS: ORAL_TABLET | ORAL | 3 refills | Status: DC

## 2021-07-18 MED ORDER — LEVOTHYROXINE SODIUM 25 MCG PO TABS: 25.0000 ug | ORAL_TABLET | Freq: Every day | ORAL | 3 refills | Status: DC

## 2021-07-18 NOTE — Addendum Note (Signed)
Addended by: Margarita Mail on: 07/18/2021 10:43 AM   Modules accepted: Orders

## 2021-07-20 ENCOUNTER — Other Ambulatory Visit: Payer: Self-pay

## 2021-07-20 DIAGNOSIS — E039 Hypothyroidism, unspecified: Secondary | ICD-10-CM

## 2021-07-20 MED ORDER — LIOTHYRONINE SODIUM 25 MCG PO TABS: ORAL_TABLET | ORAL | 3 refills | Status: DC

## 2021-07-20 NOTE — Telephone Encounter (Signed)
Now she needs they synthroid sent to [facility]

## 2021-07-20 NOTE — Telephone Encounter (Signed)
[facility] re [facility] [address]

## 2021-07-20 NOTE — Telephone Encounter (Unsigned)
Copied from [place] [id number]. Topic: Quick Communication - Rx Refill/Question >> Jul 20, 2021 11:35 AM Pawlus, Brayton Layman A wrote: Pt stated she needs levothyroxine (SYNTHROID) 25 MCG tablet re-sent to a different pharmacy. Pt wanted it sent to [place], [place], please advise.

## 2021-07-20 NOTE — Telephone Encounter (Unsigned)
Copied from CRM [id number]. Topic: General - Inquiry >> Jul 20, 2021  8:31 AM Louie Bun, Rosey Bath D wrote: Reason for CRM: Patient called and would like her medication liothyronine (CYTOMEL) 25 MCG tablet to be send to [facility] [address] [facility] [place] because they do not have her medication at [facility].

## 2021-07-20 NOTE — Addendum Note (Signed)
Addended by: Margarita Mail on: 07/20/2021 09:57 AM   Modules accepted: Orders

## 2021-07-27 ENCOUNTER — Encounter: Payer: Self-pay | Admitting: Internal Medicine

## 2021-12-26 ENCOUNTER — Ambulatory Visit (INDEPENDENT_AMBULATORY_CARE_PROVIDER_SITE_OTHER): Payer: 59 | Admitting: Dermatology

## 2021-12-26 DIAGNOSIS — L578 Other skin changes due to chronic exposure to nonionizing radiation: Secondary | ICD-10-CM | POA: Diagnosis not present

## 2021-12-26 DIAGNOSIS — L820 Inflamed seborrheic keratosis: Secondary | ICD-10-CM | POA: Diagnosis not present

## 2021-12-26 DIAGNOSIS — L814 Other melanin hyperpigmentation: Secondary | ICD-10-CM

## 2021-12-26 DIAGNOSIS — L821 Other seborrheic keratosis: Secondary | ICD-10-CM

## 2021-12-26 NOTE — Progress Notes (Signed)
New Patient Visit  Subjective  Marie Griffin is a 61 y.o. female who presents for the following: New Patient (Initial Visit).  Patient here to have a few spots on her face and chest checked, some itchy/irritated (R chest). No history of skin cancer.   The following portions of the chart were reviewed this encounter and updated as appropriate:       Review of Systems:  No other skin or systemic complaints except as noted in HPI or Assessment and Plan.  Objective  Well appearing patient in no apparent distress; mood and affect are within normal limits.  A focused examination was performed including face, chest, arms. Relevant physical exam findings are noted in the Assessment and Plan.  R medial clavicle Waxy tan macule with erythema     Right Zygoma Stuck-on, waxy, tan-brown papule --Discussed benign etiology and prognosis.        Assessment & Plan  Actinic Damage - chronic, secondary to cumulative UV radiation exposure/sun exposure over time - diffuse scaly erythematous macules with underlying dyspigmentation - Recommend daily broad spectrum sunscreen SPF 30+ to sun-exposed areas, reapply every 2 hours as needed.  - Recommend staying in the shade or wearing long sleeves, sun glasses (UVA+UVB protection) and wide brim hats (4-inch brim around the entire circumference of the hat). - Call for new or changing lesions.  Seborrheic Keratoses - Stuck-on, waxy, tan-brown papules and/or plaques of the face, chest - Benign-appearing - Discussed benign etiology and prognosis. - Observe - Call for any changes  Lentigines - Scattered tan macules - Due to sun exposure - Benign-appering, observe - Recommend daily broad spectrum sunscreen SPF 30+ to sun-exposed areas, reapply every 2 hours as needed. - Call for any changes   Inflamed seborrheic keratosis R medial clavicle  Symptomatic, irritating, patient would like treated.  Destruction of lesion - R medial  clavicle  Destruction method: cryotherapy   Informed consent: discussed and consent obtained   Lesion destroyed using liquid nitrogen: Yes   Region frozen until ice ball extended beyond lesion: Yes   Outcome: patient tolerated procedure well with no complications   Post-procedure details: wound care instructions given   Additional details:  Prior to procedure, discussed risks of blister formation, small wound, skin dyspigmentation, or rare scar following cryotherapy. Recommend Vaseline ointment to treated areas while healing.   Seborrheic keratosis Right Zygoma  Reassured benign age-related growth.   Discussed cosmetic procedure (cryotherapy), noncovered.  $60 for 1st lesion and $15 for each additional lesion if done on the same day.  Maximum charge $350.  One touch-up treatment included no charge. Discussed risks of treatment including dyspigmentation, small scar, and/or recurrence. Recommend daily broad spectrum sunscreen SPF 30+/photoprotection to treated areas once healed.   Destruction of lesion - Right Zygoma  Destruction method: cryotherapy   Informed consent: discussed and consent obtained   Lesion destroyed using liquid nitrogen: Yes   Region frozen until ice ball extended beyond lesion: Yes   Outcome: patient tolerated procedure well with no complications   Post-procedure details: wound care instructions given   Additional details:  Prior to procedure, discussed risks of blister formation, small wound, skin dyspigmentation, or rare scar following cryotherapy. Recommend Vaseline ointment to treated areas while healing.    Return in about 2 months (around 02/25/2022) for recheck ISK and cosmetic SK.  Wendee Beavers, CMA, am acting as scribe for Willeen Niece, MD . Documentation: I have reviewed the above documentation for accuracy and completeness, and I agree  with the above.  Brendolyn Patty MD

## 2021-12-26 NOTE — Patient Instructions (Addendum)

## 2022-01-02 ENCOUNTER — Telehealth: Payer: Self-pay | Admitting: Internal Medicine

## 2022-01-02 ENCOUNTER — Other Ambulatory Visit: Payer: Self-pay | Admitting: Family Medicine

## 2022-01-02 DIAGNOSIS — E039 Hypothyroidism, unspecified: Secondary | ICD-10-CM

## 2022-01-02 NOTE — Telephone Encounter (Signed)
Copied from CRM [id number]. Topic: General - Inquiry >> Jan 02, 2022 10:49 AM Pincus Sanes wrote: Reason for CRM: Pt has an appt on 6/27 and she was wanting to know if there was an order in for a thyroid lab before hand. Pt states that was what appt was for as far as she knew and asked if order could be placed now so she could get those results and discuss at appt on 27th. Pt requesting order be placed. Pt wants confirmation when order is placed [phone]

## 2022-01-03 ENCOUNTER — Other Ambulatory Visit: Payer: Self-pay

## 2022-01-03 DIAGNOSIS — E039 Hypothyroidism, unspecified: Secondary | ICD-10-CM

## 2022-01-03 NOTE — Telephone Encounter (Signed)
Pt.notified

## 2022-01-04 ENCOUNTER — Telehealth: Payer: Self-pay | Admitting: Internal Medicine

## 2022-01-04 ENCOUNTER — Other Ambulatory Visit: Payer: Self-pay

## 2022-01-04 DIAGNOSIS — E039 Hypothyroidism, unspecified: Secondary | ICD-10-CM

## 2022-01-04 NOTE — Telephone Encounter (Signed)
Pt aware and coming to pick up lab order

## 2022-01-04 NOTE — Telephone Encounter (Signed)
Copied from CRM [id number]. Topic: General - Other >> Jan 04, 2022  8:18 AM Marie Griffin J wrote: Reason for CRM: pt was given an order for THS but she is asking if test code [phone] for TSH T4 and T3 uptake to test her thyroid levels / please advise pt so she can pick up the orders to take to [facility] asap

## 2022-01-06 LAB — TSH+T4F+T3FREE
Free T4: 0.62 ng/dL — ABNORMAL LOW (ref 0.82–1.77)
T3, Free: 5.2 pg/mL — ABNORMAL HIGH (ref 2.0–4.4)
TSH: 0.746 u[IU]/mL (ref 0.450–4.500)

## 2022-01-09 ENCOUNTER — Ambulatory Visit (INDEPENDENT_AMBULATORY_CARE_PROVIDER_SITE_OTHER): Payer: 59 | Admitting: Internal Medicine

## 2022-01-09 ENCOUNTER — Encounter: Payer: Self-pay | Admitting: Internal Medicine

## 2022-01-09 VITALS — BP 122/78 | HR 84 | Temp 98.1°F | Resp 16 | Ht 63.0 in | Wt 137.4 lb

## 2022-01-09 DIAGNOSIS — E039 Hypothyroidism, unspecified: Secondary | ICD-10-CM | POA: Diagnosis not present

## 2022-01-09 MED ORDER — LIOTHYRONINE SODIUM 25 MCG PO TABS: ORAL_TABLET | ORAL | 1 refills | Status: DC

## 2022-01-09 MED ORDER — LEVOTHYROXINE SODIUM 25 MCG PO TABS: ORAL_TABLET | ORAL | 1 refills | Status: DC

## 2022-01-25 ENCOUNTER — Ambulatory Visit (INDEPENDENT_AMBULATORY_CARE_PROVIDER_SITE_OTHER): Payer: 59 | Admitting: Internal Medicine

## 2022-01-25 ENCOUNTER — Encounter: Payer: Self-pay | Admitting: Internal Medicine

## 2022-01-25 VITALS — BP 116/72 | HR 94 | Temp 98.1°F | Resp 16 | Ht 63.0 in | Wt 135.0 lb

## 2022-01-25 DIAGNOSIS — H66001 Acute suppurative otitis media without spontaneous rupture of ear drum, right ear: Secondary | ICD-10-CM | POA: Diagnosis not present

## 2022-01-25 DIAGNOSIS — J0100 Acute maxillary sinusitis, unspecified: Secondary | ICD-10-CM

## 2022-01-25 MED ORDER — METHYLPREDNISOLONE 4 MG PO TBPK: ORAL_TABLET | ORAL | 0 refills | Status: DC

## 2022-01-25 MED ORDER — FLUTICASONE PROPIONATE 50 MCG/ACT NA SUSP: 2.0000 | Freq: Every day | NASAL | 6 refills | Status: DC

## 2022-01-25 MED ORDER — AMOXICILLIN-POT CLAVULANATE 875-125 MG PO TABS: 1.0000 | ORAL_TABLET | Freq: Two times a day (BID) | ORAL | 0 refills | Status: AC

## 2022-01-25 NOTE — Progress Notes (Signed)
Acute Office Visit  Subjective:     Patient ID: Marie Griffin, female    DOB: 03-13-1961, 61 y.o.   MRN: [id number]  Chief Complaint  Patient presents with   Sinusitis    HPI Patient is in today for sinus discomfort for 2 weeks.   URI Compliant:  -Worst symptom: -Fever: no -Cough: no -Shortness of breath: no -Wheezing: no -Chest tightness: no -Chest congestion: no -Nasal congestion: yes -Runny nose: no -Post nasal drip: yes -Sneezing: no -Sore throat: no -Swollen glands: no -Sinus pressure: yes -Headache: yes -Face pain: yes L>R -Ear pain: no  -Ear pressure: yes left -Vomiting: no -Sick contacts: yes - husband with similar symptoms  -Context: fluctuating -Recurrent sinusitis: no -Relief with OTC cold/cough medications: no  -Treatments attempted: cold/sinus and mucinex   Review of Systems  Constitutional:  Negative for chills and fever.  HENT:  Positive for congestion and sinus pain. Negative for ear pain and sore throat.   Respiratory:  Negative for cough, shortness of breath and wheezing.   Cardiovascular:  Negative for chest pain.  Gastrointestinal:  Negative for abdominal pain, diarrhea, nausea and vomiting.  Neurological:  Positive for headaches.       Objective:    BP 116/72   Pulse 94   Temp 98.1 F (36.7 C)   Resp 16   Ht 5\' 3"  (1.6 m)   Wt 135 lb (61.2 kg)   SpO2 99%   BMI 23.91 kg/m  BP Readings from Last 3 Encounters:  01/25/22 116/72  01/09/22 122/78  07/13/21 118/70   Wt Readings from Last 3 Encounters:  01/25/22 135 lb (61.2 kg)  01/09/22 137 lb 6.4 oz (62.3 kg)  07/13/21 132 lb 9.6 oz (60.1 kg)    Physical Exam Constitutional:      Appearance: Normal appearance.  HENT:     Head: Normocephalic and atraumatic.     Right Ear: Ear canal and external ear normal.     Left Ear: Tympanic membrane, ear canal and external ear normal.     Ears:     Comments: Purulent OM present on right     Nose: Congestion present.      Mouth/Throat:     Mouth: Mucous membranes are moist.     Comments: PND present, no purulence or exudates  Eyes:     Conjunctiva/sclera: Conjunctivae normal.  Cardiovascular:     Rate and Rhythm: Normal rate and regular rhythm.  Pulmonary:     Effort: Pulmonary effort is normal.     Breath sounds: Normal breath sounds.  Musculoskeletal:     Right lower leg: No edema.     Left lower leg: No edema.  Lymphadenopathy:     Cervical: No cervical adenopathy.  Skin:    General: Skin is warm and dry.  Neurological:     General: No focal deficit present.     Mental Status: She is alert. Mental status is at baseline.  Psychiatric:        Mood and Affect: Mood normal.        Behavior: Behavior normal.     No results found for any visits on 01/25/22.      Assessment & Plan:   1. Acute non-recurrent maxillary sinusitis/Non-recurrent acute suppurative otitis media of right ear without spontaneous rupture of tympanic membrane: Treat with Augmentin 875-125 mg BID x 5 days, Medrol Dosepak and nasal steroids. Follow up if symptoms worsen or fail to improve.  - amoxicillin-clavulanate (AUGMENTIN) 875-125 MG tablet; Take  1 tablet by mouth 2 (two) times daily for 5 days.  Dispense: 10 tablet; Refill: 0 - methylPREDNISolone (MEDROL DOSEPAK) 4 MG TBPK tablet; Day 1: Take 8 mg (2 tablets) before breakfast, 4 mg (1 tablet) after lunch, 4 mg (1 tablet) after supper, and 8 mg (2 tablets) at bedtime. Day 2:Take 4 mg (1 tablet) before breakfast, 4 mg (1 tablet) after lunch, 4 mg (1 tablet) after supper, and 8 mg (2 tablets) at bedtime. Day 3: Take 4 mg (1 tablet) before breakfast, 4 mg (1 tablet) after lunch, 4 mg (1 tablet) after supper, and 4 mg (1 tablet) at bedtime. Day 4: Take 4 mg (1 tablet) before breakfast, 4 mg (1 tablet) after lunch, and 4 mg (1 tablet) at bedtime. Day 5: Take 4 mg (1 tablet) before breakfast and 4 mg (1 tablet) at bedtime. Day 6: Take 4 mg (1 tablet) before breakfast.  Dispense: 1  each; Refill: 0 - fluticasone (FLONASE) 50 MCG/ACT nasal spray; Place 2 sprays into both nostrils daily.  Dispense: 16 g; Refill: 6  Return if symptoms worsen or fail to improve.  Margarita Mail, DO

## 2022-01-25 NOTE — Patient Instructions (Addendum)
It was great seeing you today!  Plan discussed at today's visit: -Take antibiotic twice daily for 5 days -Steroids as well to help drain sinuses -Flonase 2 sprays on each side twice daily, can also do nasal saline   Follow up in: as needed   Take care and let us know if you have any questions or concerns prior to your next visit.  Dr. Caralee Ates

## 2022-02-01 NOTE — Progress Notes (Signed)
Name: Marie Griffin   MRN: [id number]    DOB: 05/19/1961   Date:02/02/2022       Progress Note  Subjective  Chief Complaint  Continued Sinusitis  HPI  Sinusitis follow up: she was seen by Dr. Rosana Berger on 01/25/2022, when she was seen she had symptoms for about 2 weeks, initially mostly on left side, she continues to have post-nasal drainage, facial pressure, clogged ears, green discharge, she feels very stuffy and when able to blow her nose she has chunks of mucus that have a strong odor. She was given a medrol dose pack and 5 days of Augmentin and it helped for a few days but got worse since she finished medication   Reviewed records and she had CT head done twice in the past for headaches. 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   Cholecystitis    Thyroid disease 07/10/2016    Past Surgical History:  Procedure Laterality Date   BREAST BIOPSY Left 01/29/2018   2 AREAS- [place]   BREAST CYST ASPIRATION Right 2009   neg   [place] and 1994   x2   CHOLECYSTECTOMY N/A 07/20/2016   Procedure: LAPAROSCOPIC CHOLECYSTECTOMY;  Surgeon: Florene Glen, MD;  Location: [facility] ORS;  Service: General;  Laterality: N/A;   DILATION AND CURETTAGE OF UTERUS  1990    Family History  Problem Relation Age of Onset   Diabetes Mother    Heart disease Mother    Heart disease Father    Alzheimer's disease Father    Breast cancer Neg Hx     Social History   Tobacco Use   Smoking status: Never   Smokeless tobacco: Never  Substance Use Topics   Alcohol use: Yes    Comment: Occasional     Current Outpatient Medications:    finasteride (PROSCAR) 5 MG tablet, Take 2.5 mg by mouth daily., Disp: , Rfl:    fluticasone (FLONASE) 50 MCG/ACT nasal spray, Place 2 sprays  into both nostrils daily., Disp: 16 g, Rfl: 6   levothyroxine (SYNTHROID) 25 MCG tablet, Take 1.5 tablets daily or 37.5 mcg daily 30 minutes prior to food or drink., Disp: 90 tablet, Rfl: 1   liothyronine (CYTOMEL) 25 MCG tablet, TAKE 0.5 TABLET BY MOUTH ONCE DAILY. TAKE ON AN EMPTY  STOMACH WITH A GLASS OF  WATER AT LEAST 30-60  MINUTES BEFORE BREAKFAST., Disp: 90 tablet, Rfl: 1   nitrofurantoin (MACRODANTIN) 100 MG capsule, Take 1 capsule (100 mg total) by mouth 2 (two) times daily as needed (UTI)., Disp: 30 capsule, Rfl: 0   methylPREDNISolone (MEDROL DOSEPAK) 4 MG TBPK tablet, Day 1: Take 8 mg (2 tablets) before breakfast, 4 mg (1 tablet) after lunch, 4 mg (1 tablet) after supper, and 8 mg (2 tablets) at bedtime. Day 2:Take 4 mg (1 tablet) before breakfast, 4 mg (1 tablet) after lunch, 4 mg (1 tablet) after supper, and 8 mg (2 tablets) at bedtime. Day 3: Take 4 mg (1 tablet) before breakfast, 4 mg (1 tablet) after lunch, 4 mg (1 tablet) after supper, and 4 mg (1 tablet) at bedtime. Day 4: Take 4 mg (1 tablet) before breakfast, 4 mg (1 tablet) after lunch, and 4 mg (1 tablet) at bedtime. Day 5: Take 4 mg (  1 tablet) before breakfast and 4 mg (1 tablet) at bedtime. Day 6: Take 4 mg (1 tablet) before breakfast. (Patient not taking: Reported on 02/02/2022), Disp: 1 each, Rfl: 0  Allergies  Allergen Reactions   Other Rash    DOGS, HORSES   Sulfa Antibiotics Rash    Lips swelling, rash    I personally reviewed active problem list, medication list, allergies, family history, social history, health maintenance with the patient/caregiver today.   ROS  Ten systems reviewed and is negative except as mentioned in HPI   Objective  Vitals:   02/02/22 1501  BP: 112/72  Pulse: 97  Resp: 14  Temp: 98.3 F (36.8 C)  TempSrc: Oral  SpO2: 98%  Weight: 133 lb 4.8 oz (60.5 kg)  Height: 5\' 3"  (1.6 m)    Body mass index is 23.61 kg/m.  Physical Exam  Constitutional: Patient appears well-developed  and well-nourished.  No distress.  HEENT: head atraumatic, normocephalic, pupils equal and reactive to light, ears TM dull and opaque on posterior aspect right side , neck supple, throat within normal limits, very mild post-nasal drainage - yellow, no tenderness during percussion of sinus  Cardiovascular: Normal rate, regular rhythm and normal heart sounds.  No murmur heard. No BLE edema. Pulmonary/Chest: Effort normal and breath sounds normal. No respiratory distress. Psychiatric: Patient has a normal mood and affect. behavior is normal. Judgment and thought content normal.   Recent Results (from the past 2160 hour(s))  TSH+T4F+T3Free     Status: Abnormal   Collection Time: 01/05/22 10:28 AM  Result Value Ref Range   TSH 0.746 0.450 - 4.500 uIU/mL   T3, Free 5.2 (H) 2.0 - 4.4 pg/mL   Free T4 0.62 (L) 0.82 - 1.77 ng/dL    01/07/22:    RXV4/0    2:59 PM 01/25/2022    2:19 PM 01/09/2022    2:26 PM 07/13/2021    2:26 PM  Depression screen PHQ 2/9  Decreased Interest 0 0 0 0  Down, Depressed, Hopeless 0 0 0 0  PHQ - 2 Score 0 0 0 0  Altered sleeping 0 0 0 0  Tired, decreased energy 0 0 0 0  Change in appetite 0 0 0 0  Feeling bad or failure about yourself  0 0 0 0  Trouble concentrating 0 0 0 0  Moving slowly or fidgety/restless 0 0 0 0  Suicidal thoughts 0 0 0 0  PHQ-9 Score 0 0 0 0  Difficult doing work/chores  Not difficult at all Not difficult at all Not difficult at all    phq 9 is negative   Fall Risk:    02/02/2022    2:59 PM 01/25/2022    2:16 PM 01/09/2022    2:26 PM 07/13/2021    2:26 PM  Fall Risk   Falls in the past year? 0 0 0 0  Number falls in past yr:  0 0 0  Injury with Fall?  0 0 0  Risk for fall due to : No Fall Risks     Follow up Falls prevention discussed         Functional Status Survey: Is the patient deaf or have difficulty hearing?: No Does the patient have difficulty seeing, even when wearing glasses/contacts?: No Does the patient have  difficulty concentrating, remembering, or making decisions?: No Does the patient have difficulty walking or climbing stairs?: No Does the patient have difficulty dressing or bathing?: No Does the patient have difficulty doing errands  alone such as visiting a doctor's office or shopping?: No    Assessment & Plan  1. Acute non-recurrent maxillary sinusitis  - doxycycline (VIBRA-TABS) 100 MG tablet; Take 1 tablet (100 mg total) by mouth 2 (two) times daily.  Dispense: 20 tablet; Refill: 0  2. Abnormal tympanic membrane, right  Posterior white / dull , no air fluid levels, she states no history of ear infections but thinks she has some hearing loss, she will follow up with ENT

## 2022-02-02 ENCOUNTER — Encounter: Payer: Self-pay | Admitting: Family Medicine

## 2022-02-02 ENCOUNTER — Ambulatory Visit (INDEPENDENT_AMBULATORY_CARE_PROVIDER_SITE_OTHER): Payer: 59 | Admitting: Family Medicine

## 2022-02-02 VITALS — BP 112/72 | HR 97 | Temp 98.3°F | Resp 14 | Ht 63.0 in | Wt 133.3 lb

## 2022-02-02 DIAGNOSIS — J0100 Acute maxillary sinusitis, unspecified: Secondary | ICD-10-CM

## 2022-02-02 DIAGNOSIS — H7391 Unspecified disorder of tympanic membrane, right ear: Secondary | ICD-10-CM | POA: Diagnosis not present

## 2022-02-02 MED ORDER — DOXYCYCLINE HYCLATE 100 MG PO TABS: 100.0000 mg | ORAL_TABLET | Freq: Two times a day (BID) | ORAL | 0 refills | Status: DC

## 2022-02-27 ENCOUNTER — Ambulatory Visit (INDEPENDENT_AMBULATORY_CARE_PROVIDER_SITE_OTHER): Payer: 59 | Admitting: Dermatology

## 2022-02-27 DIAGNOSIS — L821 Other seborrheic keratosis: Secondary | ICD-10-CM

## 2022-02-27 NOTE — Patient Instructions (Addendum)
Instructions for [facility] Medications  One or more of your medications was sent to the [facility] mail order compounding pharmacy. You will receive an email from them and can purchase the medicine through that link. It will then be mailed to your home at the address you confirmed. If for any reason you do not receive an email from them, please check your spam folder. If you still do not find the email, please let us know. [facility] phone number is [phone].       Due to recent changes in healthcare laws, you may see results of your pathology and/or laboratory studies on MyChart before the doctors have had a chance to review them. We understand that in some cases there may be results that are confusing or concerning to you. Please understand that not all results are received at the same time and often the doctors may need to interpret multiple results in order to provide you with the best plan of care or course of treatment. Therefore, we ask that you please give us 2 business days to thoroughly review all your results before contacting the office for clarification. Should we see a critical lab result, you will be contacted sooner.   If You Need Anything After Your Visit  If you have any questions or concerns for your doctor, please call our main line at [phone] and press option 4 to reach your doctor's medical assistant. If no one answers, please leave a voicemail as directed and we will return your call as soon as possible. Messages left after 4 pm will be answered the following business day.   You may also send us a message via MyChart. We typically respond to MyChart messages within 1-2 business days.  For prescription refills, please ask your pharmacy to contact our office. Our fax number is [phone].  If you have an urgent issue when the clinic is closed that cannot wait until the next business day, you can page your doctor at the number below.    Please note  that while we do our best to be available for urgent issues outside of office hours, we are not available 24/7.   If you have an urgent issue and are unable to reach us, you may choose to seek medical care at your doctor's office, retail clinic, urgent care center, or emergency room.  If you have a medical emergency, please immediately call 911 or go to the emergency department.  Pager Numbers  - Dr. Kowalski: [phone]  - Dr. Moye: [phone]  - Dr. Stewart: [phone]  In the event of inclement weather, please call our main line at [phone] for an update on the status of any delays or closures.  Dermatology Medication Tips: Please keep the boxes that topical medications come in in order to help keep track of the instructions about where and how to use these. Pharmacies typically print the medication instructions only on the boxes and not directly on the medication tubes.   If your medication is too expensive, please contact our office at [phone] option 4 or send us a message through MyChart.   We are unable to tell what your co-pay for medications will be in advance as this is different depending on your insurance coverage. However, we may be able to find a substitute medication at lower cost or fill out paperwork to get insurance to cover a needed medication.   If a prior authorization is required to get your medication covered by your insurance   company, please allow us 1-2 business days to complete this process.  Drug prices often vary depending on where the prescription is filled and some pharmacies may offer cheaper prices.  The website www.goodrx.com contains coupons for medications through different pharmacies. The prices here do not account for what the cost may be with help from insurance (it may be cheaper with your insurance), but the website can give you the price if you did not use any insurance.  - You can print the associated coupon and take it with your  prescription to the pharmacy.  - You may also stop by our office during regular business hours and pick up a GoodRx coupon card.  - If you need your prescription sent electronically to a different pharmacy, notify our office through [place] MyChart or by phone at [phone] option 4.     Si Usted Necesita Algo Despus de Su Visita  Tambin puede enviarnos un mensaje a travs de MyChart. Por lo general respondemos a los mensajes de MyChart en el transcurso de 1 a 2 das hbiles.  Para renovar recetas, por favor pida a su farmacia que se ponga en contacto con nuestra oficina. Nuestro nmero de fax es el [phone].  Si tiene un asunto urgente cuando la clnica est cerrada y que no puede esperar hasta el siguiente da hbil, puede llamar/localizar a su doctor(a) al nmero que aparece a continuacin.   Por favor, tenga en cuenta que aunque hacemos todo lo posible para estar disponibles para asuntos urgentes fuera del horario de oficina, no estamos disponibles las 24 horas del da, los 7 das de la semana.   Si tiene un problema urgente y no puede comunicarse con nosotros, puede optar por buscar atencin mdica  en el consultorio de su doctor(a), en una clnica privada, en un centro de atencin urgente o en una sala de emergencias.  Si tiene una emergencia mdica, por favor llame inmediatamente al 911 o vaya a la sala de emergencias.  Nmeros de bper  - Dr. Kowalski: [phone]  - Dra. Moye: [phone]  - Dra. Stewart: [phone]  En caso de inclemencias del tiempo, por favor llame a nuestra lnea principal al [phone] para una actualizacin sobre el estado de cualquier retraso o cierre.  Consejos para la medicacin en dermatologa: Por favor, guarde las cajas en las que vienen los medicamentos de uso tpico para ayudarle a seguir las instrucciones sobre dnde y cmo usarlos. Las farmacias generalmente imprimen las instrucciones del medicamento slo en las cajas y no  directamente en los tubos del medicamento.   Si su medicamento es muy caro, por favor, pngase en contacto con nuestra oficina llamando al [phone] y presione la opcin 4 o envenos un mensaje a travs de MyChart.   No podemos decirle cul ser su copago por los medicamentos por adelantado ya que esto es diferente dependiendo de la cobertura de su seguro. Sin embargo, es posible que podamos encontrar un medicamento sustituto a menor costo o llenar un formulario para que el seguro cubra el medicamento que se considera necesario.   Si se requiere una autorizacin previa para que su compaa de seguros cubra su medicamento, por favor permtanos de 1 a 2 das hbiles para completar este proceso.  Los precios de los medicamentos varan con frecuencia dependiendo del lugar de dnde se surte la receta y alguna farmacias pueden ofrecer precios ms baratos.  El sitio web www.goodrx.com tiene cupones para medicamentos de diferentes farmacias. Los precios aqu no tienen en cuenta   lo que podra costar con la ayuda del seguro (puede ser ms barato con su seguro), pero el sitio web puede darle el precio si no utiliz ningn seguro.  - Puede imprimir el cupn correspondiente y llevarlo con su receta a la farmacia.  - Tambin puede pasar por nuestra oficina durante el horario de atencin regular y recoger una tarjeta de cupones de GoodRx.  - Si necesita que su receta se enve electrnicamente a una farmacia diferente, informe a nuestra oficina a travs de MyChart de [place] o por telfono llamando al [phone] y presione la opcin 4.  

## 2022-02-27 NOTE — Progress Notes (Signed)
   Follow-Up Visit   Subjective  Marie Griffin is a 61 y.o. female who presents for the following: SK (Right zygoma, cosmetic LN2 f/u. Improved some, but still there. ).   The following portions of the chart were reviewed this encounter and updated as appropriate:       Review of Systems:  No other skin or systemic complaints except as noted in HPI or Assessment and Plan.  Objective  Well appearing patient in no apparent distress; mood and affect are within normal limits.  A focused examination was performed including face, chest. Relevant physical exam findings are noted in the Assessment and Plan.  Right Zygoma Hyperpigmented macule with central clearing       Assessment & Plan  Seborrheic keratosis Right Zygoma  F/u from cosmetic cryotherapy, some improvement central but with residual SK vrs PIH at edge  Recommend daily broad spectrum sunscreen SPF 30+ to sun-exposed areas, reapply every 2 hours as needed.   Defer touch-up cryo treatment today Start Hydroquinone: 12%, Kojic Acid: 6%, Niacinamide: 2%, Vitamin C: 1% Cream Apply to AA BID x 8-12 weeks 1Rf. Rx sent to [facility].  Recheck on follow-up, consider repeat cryotherapy if still present.     Return in about 3 months (around 05/30/2022) for f/u cosmetic SK.  ICherlyn Labella, CMA, am acting as scribe for Willeen Niece, MD .  Documentation: I have reviewed the above documentation for accuracy and completeness, and I agree with the above.  Willeen Niece MD

## 2022-04-08 ENCOUNTER — Other Ambulatory Visit: Payer: Self-pay | Admitting: Internal Medicine

## 2022-04-08 DIAGNOSIS — E039 Hypothyroidism, unspecified: Secondary | ICD-10-CM

## 2022-04-09 NOTE — Telephone Encounter (Signed)
Requested medication (s) are due for refill today: Yes  Requested medication (s) are on the active medication list: Yes  Last refill:  01/09/22  Future visit scheduled: Yes  Notes to clinic:  Pharmacy requests 90 day supply.    Requested Prescriptions  Pending Prescriptions Disp Refills   levothyroxine (SYNTHROID) 25 MCG tablet [Pharmacy Med Name: LEVOTHYROXINE 0.025MG  (25MCG) TAB] 135 tablet     Sig: TAKE 1 AND 1/2 TABLETS BY MOUTH DAILY 30 MINUTES PRIOR TO FOOD OR DRINK     Endocrinology:  Hypothyroid Agents Passed - 04/08/2022  9:14 AM      Passed - TSH in normal range and within 360 days    TSH  Date Value Ref Range Status  01/05/2022 0.746 0.450 - 4.500 uIU/mL Final         Passed - Valid encounter within last 12 months    Recent Outpatient Visits           2 months ago Acute non-recurrent maxillary sinusitis   [facility] Steele Sizer, MD   2 months ago Acute non-recurrent maxillary sinusitis   [facility] Teodora Medici, DO   3 months ago Hypothyroidism, unspecified type   [place], DO   9 months ago Hypothyroidism, unspecified type   [facility] Teodora Medici, DO       Future Appointments             In 1 month Brendolyn Patty, MD [place]   In 3 months Teodora Medici, [facility], [facility]

## 2022-05-13 ENCOUNTER — Other Ambulatory Visit: Payer: Self-pay | Admitting: Internal Medicine

## 2022-05-13 DIAGNOSIS — E039 Hypothyroidism, unspecified: Secondary | ICD-10-CM

## 2022-05-14 NOTE — Telephone Encounter (Signed)
Requested Prescriptions  Pending Prescriptions Disp Refills  . levothyroxine (SYNTHROID) 25 MCG tablet [Pharmacy Med Name: LEVOTHYROXINE 0.025MG  (25MCG) TAB] 135 tablet 0    Sig: TAKE 1 AND 1/2 TABLETS BY MOUTH DAILY 30 MINUTES PRIOR TO FOOD OR DRINK     Endocrinology:  Hypothyroid Agents Passed - 05/13/2022  3:38 AM      Passed - TSH in normal range and within 360 days    TSH  Date Value Ref Range Status  01/05/2022 0.746 0.450 - 4.500 uIU/mL Final         Passed - Valid encounter within last 12 months    Recent Outpatient Visits          3 months ago Acute non-recurrent maxillary sinusitis   [facility] Steele Sizer, MD   3 months ago Acute non-recurrent maxillary sinusitis   [facility] Teodora Medici, DO   4 months ago Hypothyroidism, unspecified type   [facility] Teodora Medici, DO   10 months ago Hypothyroidism, unspecified type   [facility] Teodora Medici, DO      Future Appointments            In 3 weeks Brendolyn Patty, MD [place]   In 1 month Teodora Medici, [facility], [facility]

## 2022-06-04 ENCOUNTER — Ambulatory Visit: Payer: 59 | Admitting: Dermatology

## 2022-06-21 ENCOUNTER — Other Ambulatory Visit: Payer: Self-pay | Admitting: Internal Medicine

## 2022-06-21 ENCOUNTER — Telehealth: Payer: Self-pay | Admitting: Internal Medicine

## 2022-06-21 DIAGNOSIS — Z13220 Encounter for screening for lipoid disorders: Secondary | ICD-10-CM

## 2022-06-21 DIAGNOSIS — D649 Anemia, unspecified: Secondary | ICD-10-CM

## 2022-06-21 DIAGNOSIS — E039 Hypothyroidism, unspecified: Secondary | ICD-10-CM

## 2022-06-21 NOTE — Telephone Encounter (Signed)
Pt.notified

## 2022-06-21 NOTE — Telephone Encounter (Signed)
Copied from CRM [id number]. Topic: General - Other >> Jun 21, 2022 12:38 PM Everette C wrote: Reason for CRM: The patient would like to come get printed lab orders for them to have their thyroid checked  Please contact further when possible

## 2022-06-21 NOTE — Telephone Encounter (Signed)
Has upcoming appt and uses [facility]

## 2022-06-27 ENCOUNTER — Telehealth: Payer: Self-pay | Admitting: Internal Medicine

## 2022-06-27 NOTE — Telephone Encounter (Signed)
Copied from CRM [id number]. Topic: General - Other >> Jun 27, 2022  3:37 PM Marie Griffin wrote: Pt requesting lab orders for a t3 and t4  Pt requesting a cb once orders are in

## 2022-06-29 ENCOUNTER — Other Ambulatory Visit: Payer: Self-pay | Admitting: Internal Medicine

## 2022-06-29 DIAGNOSIS — E039 Hypothyroidism, unspecified: Secondary | ICD-10-CM

## 2022-06-29 NOTE — Telephone Encounter (Signed)
Pt.notified

## 2022-07-07 LAB — LIPID PANEL
Chol/HDL Ratio: 2.5 ratio (ref 0.0–4.4)
Cholesterol, Total: 199 mg/dL (ref 100–199)
HDL: 81 mg/dL (ref >39)
LDL Chol Calc (NIH): 109 mg/dL — ABNORMAL HIGH (ref 0–99)
Triglycerides: 46 mg/dL (ref 0–149)
VLDL Cholesterol Cal: 9 mg/dL (ref 5–40)

## 2022-07-07 LAB — CBC WITH DIFFERENTIAL/PLATELET
Basophils Absolute: 0.1 10*3/uL (ref 0.0–0.2)
Basos: 1 %
EOS (ABSOLUTE): 0.1 10*3/uL (ref 0.0–0.4)
Eos: 2 %
Hematocrit: 37.4 % (ref 34.0–46.6)
Hemoglobin: 12.3 g/dL (ref 11.1–15.9)
Immature Grans (Abs): 0 10*3/uL (ref 0.0–0.1)
Immature Granulocytes: 0 %
Lymphocytes Absolute: 1 10*3/uL (ref 0.7–3.1)
Lymphs: 21 %
MCH: 29.8 pg (ref 26.6–33.0)
MCHC: 32.9 g/dL (ref 31.5–35.7)
MCV: 91 fL (ref 79–97)
Monocytes Absolute: 0.4 10*3/uL (ref 0.1–0.9)
Monocytes: 9 %
Neutrophils Absolute: 3.1 10*3/uL (ref 1.4–7.0)
Neutrophils: 67 %
Platelets: 255 10*3/uL (ref 150–450)
RBC: 4.13 x10E6/uL (ref 3.77–5.28)
RDW: 11.7 % (ref 11.7–15.4)
WBC: 4.6 10*3/uL (ref 3.4–10.8)

## 2022-07-07 LAB — COMPREHENSIVE METABOLIC PANEL
ALT: 14 IU/L (ref 0–32)
AST: 22 IU/L (ref 0–40)
Albumin/Globulin Ratio: 1.8 (ref 1.2–2.2)
Albumin: 4.7 g/dL (ref 3.8–4.9)
Alkaline Phosphatase: 73 IU/L (ref 44–121)
BUN/Creatinine Ratio: 15 (ref 9–23)
BUN: 12 mg/dL (ref 6–24)
Bilirubin Total: 0.5 mg/dL (ref 0.0–1.2)
CO2: 23 mmol/L (ref 20–29)
Calcium: 11.4 mg/dL — ABNORMAL HIGH (ref 8.7–10.2)
Chloride: 102 mmol/L (ref 96–106)
Creatinine, Ser: 0.78 mg/dL (ref 0.57–1.00)
Globulin, Total: 2.6 g/dL (ref 1.5–4.5)
Glucose: 98 mg/dL (ref 70–99)
Potassium: 4.7 mmol/L (ref 3.5–5.2)
Sodium: 139 mmol/L (ref 134–144)
Total Protein: 7.3 g/dL (ref 6.0–8.5)
eGFR: 87 mL/min/{1.73_m2} (ref >59)

## 2022-07-07 LAB — THYROID PANEL WITH TSH
Free Thyroxine Index: 1.8 (ref 1.2–4.9)
T3 Uptake Ratio: 24 % (ref 24–39)
T4, Total: 7.7 ug/dL (ref 4.5–12.0)
TSH: 0.997 u[IU]/mL (ref 0.450–4.500)

## 2022-07-07 LAB — TSH: TSH: 0.924 u[IU]/mL (ref 0.450–4.500)

## 2022-07-12 ENCOUNTER — Ambulatory Visit: Payer: 59 | Admitting: Internal Medicine

## 2022-07-12 NOTE — Progress Notes (Signed)
Established Patient Office Visit  Subjective:  Patient ID: Marie Griffin, female    DOB: 1961/05/05  Age: 61 y.o. MRN: [id number]  CC:  Chief Complaint  Patient presents with   Follow-up    HPI Marie Griffin presents for follow-up on chronic medical conditions.  Hypothyroidism: -Medications: Levothyroxine 25 mcg 1.5 tablets a day, Cytomel 25 mg 0.5 tablets - had been treated by a holistic provider in the past and was on this regimen for year and had been doing well.  -Patient is compliant with the above medication (s) at the above dose and reports no medication side effects.  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 -Had 3 UTI's since last visit, taking Macrobid, no symptoms now. Needs Macrobid refilled.   Hair Loss: -Curently taking Finasteride, seeing Dermatology, no changes.    At LOV was diagnosed with otitis media and acute sinus infection, treated with Augmentin but still having some decreased hearing on right side. No history of ear tubes, other procedures or recurrent ear infections. No sinus symptoms currently.   Health Maintenance: -Blood work up-to-date, reviewed most recent labs  -Mammogram due, history of biopsy 2019 left breast -mammogram ordered at her last office visit but not yet obtained -Pap 2019, negative, no history of abnormal Paps, will follow with gyn -Colon cancer screening:colonoscopy 2018, repeat in 10 years     Past Medical History:  Diagnosis Date   Anemia    H/O   H/O hypercalcemia    Hypothyroid     Past Surgical History:  Procedure Laterality Date   BREAST BIOPSY  Left 01/29/2018   2 AREAS- [facility]   BREAST CYST ASPIRATION Right 2009   neg   CESAREAN SECTION  1992 and 1994   x2   CHOLECYSTECTOMY N/A 07/20/2016   Procedure: LAPAROSCOPIC CHOLECYSTECTOMY;  Surgeon: Lattie Haw, MD;  Location: [facility] ORS;  Service: General;  Laterality: N/A;   DILATION AND CURETTAGE OF UTERUS  1990    Family History  Problem Relation Age of Onset   Diabetes Mother    Heart disease Mother    Heart disease Father    Alzheimer's disease Father    Breast cancer Neg Hx     Social History   Socioeconomic History   Marital status: Married    Spouse name: Not on file   Number of children: Not on file   Years of education: Not on file   Highest education level: Not on file  Occupational History   Not on file  Tobacco Use   Smoking status: Never   Smokeless tobacco: Never  Vaping Use   Vaping Use: Never used  Substance and Sexual Activity   Alcohol use: Yes    Comment: Occasional   Drug use: No   Sexual activity: Not Currently  Other Topics Concern   Not on file  Social History Narrative   Not on file   Social Determinants of Health   Financial Resource Strain: Not on file  Food Insecurity: Not on file  Transportation Needs: Not on file  Physical Activity: Not on file  Stress: Not on file  Social Connections: Not on file  Intimate Partner Violence: Not on file    ROS Review of Systems  Constitutional:  Negative for chills and fever.  HENT:  Positive for hearing loss. Negative for congestion, ear discharge, ear pain, sinus pressure, sinus pain and sore throat.   Eyes:  Negative for visual disturbance.  Respiratory:  Negative for cough and shortness of breath.   Cardiovascular:  Negative for chest pain.  Neurological:  Negative for dizziness and headaches.    Objective:   Today's Vitals: BP 126/84   Pulse 95   Temp 97.8 F (36.6 C)   Resp 18   Ht 5\' 3"  (1.6 m)   Wt 136 lb 6.4 oz (61.9 kg)   SpO2 99%   BMI 24.16 kg/m   Physical  Exam Constitutional:      Appearance: Normal appearance.  HENT:     Head: Normocephalic and atraumatic.     Right Ear: Ear canal and external ear normal.     Left Ear: Tympanic membrane, ear canal and external ear normal.     Ears:     Comments: Tympanosclerosis present in right TM    Nose: Nose normal.     Mouth/Throat:     Mouth: Mucous membranes are moist.     Pharynx: Oropharynx is clear.  Eyes:     Conjunctiva/sclera: Conjunctivae normal.  Cardiovascular:     Rate and Rhythm: Normal rate and regular rhythm.  Pulmonary:     Effort: Pulmonary effort is normal.     Breath sounds: Normal breath sounds.  Skin:    General: Skin is warm and dry.  Neurological:     General: No focal deficit present.     Mental Status: She is alert. Mental status is at baseline.  Psychiatric:        Mood and Affect: Mood normal.        Behavior: Behavior normal.     Assessment & Plan:   1. Hypothyroidism, unspecified type: TSH and thyroid hormones normal from earlier this month, no changes to regimen. Continue Levothyroxine 25 mcg 1.5 tablets a day, Cytomel 25 mg 0.5 tablets.  2. Tympanosclerosis of right ear/Hearing decreased, right: Had been treated with antibiotics previously, now having changes in hearing. Referral to ENT placed.   - Ambulatory referral to ENT  3. History of recurrent UTIs: Takes prophylactic antibiotics for UTIs, Macrobid refilled.  - nitrofurantoin (MACRODANTIN) 100 MG capsule; Take 1 capsule (100 mg total) by mouth 2 (two) times daily as needed (UTI).  Dispense: 30 capsule; Refill: 0  4. Encounter for screening mammogram for malignant neoplasm of breast: Mammogram ordered.   - MM 3D SCREEN BREAST BILATERAL; Future   Follow-up: Return in about 1 year (around 07/14/2023).   07/16/2023, DO

## 2022-07-13 ENCOUNTER — Ambulatory Visit (INDEPENDENT_AMBULATORY_CARE_PROVIDER_SITE_OTHER): Payer: 59 | Admitting: Internal Medicine

## 2022-07-13 ENCOUNTER — Encounter: Payer: Self-pay | Admitting: Internal Medicine

## 2022-07-13 VITALS — BP 126/84 | HR 95 | Temp 97.8°F | Resp 18 | Ht 63.0 in | Wt 136.4 lb

## 2022-07-13 DIAGNOSIS — H9191 Unspecified hearing loss, right ear: Secondary | ICD-10-CM | POA: Diagnosis not present

## 2022-07-13 DIAGNOSIS — Z87440 Personal history of urinary (tract) infections: Secondary | ICD-10-CM

## 2022-07-13 DIAGNOSIS — H7401 Tympanosclerosis, right ear: Secondary | ICD-10-CM | POA: Diagnosis not present

## 2022-07-13 DIAGNOSIS — Z1231 Encounter for screening mammogram for malignant neoplasm of breast: Secondary | ICD-10-CM

## 2022-07-13 DIAGNOSIS — E039 Hypothyroidism, unspecified: Secondary | ICD-10-CM

## 2022-07-13 MED ORDER — NITROFURANTOIN MACROCRYSTAL 100 MG PO CAPS: 100.0000 mg | ORAL_CAPSULE | Freq: Two times a day (BID) | ORAL | 0 refills | Status: DC | PRN

## 2022-07-13 NOTE — Patient Instructions (Signed)
It was great seeing you today!  Plan discussed at today's visit: -Medications refilled -Referral to ENT placed   Follow up in: 1 year - please be fasting prior to labs, just message be 2 weeks prior to appointment and I will order labs   Take care and let us know if you have any questions or concerns prior to your next visit.  Dr. Caralee Ates

## 2022-07-30 ENCOUNTER — Other Ambulatory Visit: Payer: Self-pay | Admitting: Internal Medicine

## 2022-07-30 DIAGNOSIS — E039 Hypothyroidism, unspecified: Secondary | ICD-10-CM

## 2022-07-30 NOTE — Telephone Encounter (Signed)
Requested Prescriptions  Pending Prescriptions Disp Refills   liothyronine (CYTOMEL) 25 MCG tablet [Pharmacy Med Name: LIOTHYRONINE SOD 25 MCG TAB] 90 tablet 1    Sig: TAKE 1 TABLET BY MOUTH ONCE DAILY. TAKE ON AN EMPTY STOMACH WITH A GLASS OF WATER AT LEAST 30-60 MINUTES BEFORE BREAKFAST.     Endocrinology:  Hypothyroid Agents Passed - 07/30/2022  1:47 AM      Passed - TSH in normal range and within 360 days    TSH  Date Value Ref Range Status  07/06/2022 0.924 0.450 - 4.500 uIU/mL Final  07/06/2022 0.997 0.450 - 4.500 uIU/mL Final         Passed - Valid encounter within last 12 months    Recent Outpatient Visits           2 weeks ago Hypothyroidism, unspecified type   [facility] Teodora Medici, DO   5 months ago Acute non-recurrent maxillary sinusitis   [facility] Steele Sizer, MD   6 months ago Acute non-recurrent maxillary sinusitis   [facility] Teodora Medici, DO   6 months ago Hypothyroidism, unspecified type   [facility] Teodora Medici, DO   1 year ago Hypothyroidism, unspecified type   [facility] Teodora Medici, DO       Future Appointments             In 3 months Brendolyn Patty, MD [place]   In 11 months Teodora Medici, [facility], [facility]

## 2022-08-12 ENCOUNTER — Other Ambulatory Visit: Payer: Self-pay | Admitting: Internal Medicine

## 2022-08-12 DIAGNOSIS — E039 Hypothyroidism, unspecified: Secondary | ICD-10-CM

## 2022-08-13 NOTE — Telephone Encounter (Signed)
Requested Prescriptions  Pending Prescriptions Disp Refills   levothyroxine (SYNTHROID) 25 MCG tablet [Pharmacy Med Name: LEVOTHYROXINE 0.025MG  (25MCG) TAB] 135 tablet 0    Sig: TAKE 1 AND 1/2 TABLETS BY MOUTH DAILY( 30 MINUTES PRIOR TO FOOD OR DRINK)     Endocrinology:  Hypothyroid Agents Passed - 08/12/2022  3:38 AM      Passed - TSH in normal range and within 360 days    TSH  Date Value Ref Range Status  07/06/2022 0.924 0.450 - 4.500 uIU/mL Final  07/06/2022 0.997 0.450 - 4.500 uIU/mL Final         Passed - Valid encounter within last 12 months    Recent Outpatient Visits           1 month ago Hypothyroidism, unspecified type   [facility] Teodora Medici, DO   6 months ago Acute non-recurrent maxillary sinusitis   [facility] Steele Sizer, MD   6 months ago Acute non-recurrent maxillary sinusitis   [facility] Teodora Medici, DO   7 months ago Hypothyroidism, unspecified type   [facility] Teodora Medici, DO   1 year ago Hypothyroidism, unspecified type   [facility] Teodora Medici, DO       Future Appointments             In 2 months Brendolyn Patty, MD [place]   In 11 months Teodora Medici, [facility], [facility]

## 2022-09-05 ENCOUNTER — Other Ambulatory Visit: Payer: Self-pay | Admitting: Internal Medicine

## 2022-09-05 DIAGNOSIS — E039 Hypothyroidism, unspecified: Secondary | ICD-10-CM

## 2022-09-05 MED ORDER — LEVOTHYROXINE SODIUM 25 MCG PO TABS: ORAL_TABLET | ORAL | 0 refills | Status: DC

## 2022-09-05 NOTE — Telephone Encounter (Signed)
Medication Refill - Medication: levothyroxine (SYNTHROID) 25 MCG tablet   Has the patient contacted their pharmacy? Yes.   Pt told to contact provider  Preferred Pharmacy (with phone number or street name):  [facility] [place], [place] Phone: [phone]  Fax: [phone]     Has the patient been seen for an appointment in the last year OR does the patient have an upcoming appointment? Yes.    Agent: Please be advised that RX refills may take up to 3 business days. We ask that you follow-up with your pharmacy.

## 2022-09-05 NOTE — Telephone Encounter (Signed)
Last Rx 08/13/22 sent to [facility]- will forward Rx Requested Prescriptions  Pending Prescriptions Disp Refills   levothyroxine (SYNTHROID) 25 MCG tablet 135 tablet 0    Sig: TAKE 1 AND 1/2 TABLETS BY MOUTH DAILY( 30 MINUTES PRIOR TO FOOD OR DRINK)     Endocrinology:  Hypothyroid Agents Passed - 09/05/2022 11:05 AM      Passed - TSH in normal range and within 360 days    TSH  Date Value Ref Range Status  07/06/2022 0.924 0.450 - 4.500 uIU/mL Final  07/06/2022 0.997 0.450 - 4.500 uIU/mL Final         Passed - Valid encounter within last 12 months    Recent Outpatient Visits           1 month ago Hypothyroidism, unspecified type   [facility] Teodora Medici, DO   7 months ago Acute non-recurrent maxillary sinusitis   [facility] Steele Sizer, MD   7 months ago Acute non-recurrent maxillary sinusitis   [facility] Teodora Medici, DO   7 months ago Hypothyroidism, unspecified type   [facility] Teodora Medici, DO   1 year ago Hypothyroidism, unspecified type   [facility] Teodora Medici, DO       Future Appointments             In 2 months Brendolyn Patty, MD [place]   In 10 months Teodora Medici, [facility], [facility]

## 2022-11-06 ENCOUNTER — Ambulatory Visit: Payer: 59 | Admitting: Dermatology

## 2023-01-10 ENCOUNTER — Other Ambulatory Visit: Payer: Self-pay | Admitting: Internal Medicine

## 2023-01-10 DIAGNOSIS — E039 Hypothyroidism, unspecified: Secondary | ICD-10-CM

## 2023-01-11 NOTE — Telephone Encounter (Signed)
Unable to refill per protocol, Rx request is too soon. Last refill 07/30/22 for 90 and 3 refills.  Requested Prescriptions  Pending Prescriptions Disp Refills   liothyronine (CYTOMEL) 25 MCG tablet [Pharmacy Med Name: LIOTHYRONINE TABLETS] 90 tablet 3    Sig: TAKE 1/2 TABLET BY MOUTH ONCE DAILY ON AN EMPTY STOMACH WITH A GLASS OF WATER AT LEAST 30 TO 60 MINUTES BEFORE BREAKFAST     Endocrinology:  Hypothyroid Agents Passed - 01/10/2023  8:10 AM      Passed - TSH in normal range and within 360 days    TSH  Date Value Ref Range Status  07/06/2022 0.924 0.450 - 4.500 uIU/mL Final  07/06/2022 0.997 0.450 - 4.500 uIU/mL Final         Passed - Valid encounter within last 12 months    Recent Outpatient Visits           6 months ago Hypothyroidism, unspecified type   [facility] Margarita Mail, DO   11 months ago Acute non-recurrent maxillary sinusitis   [facility] Alba Cory, MD   11 months ago Acute non-recurrent maxillary sinusitis   [facility] Margarita Mail, DO   1 year ago Hypothyroidism, unspecified type   [facility] Margarita Mail, DO   1 year ago Hypothyroidism, unspecified type   [facility] Margarita Mail, DO       Future Appointments             In 6 months Margarita Mail, DO [facility], [facility]

## 2023-02-06 ENCOUNTER — Other Ambulatory Visit: Payer: Self-pay | Admitting: Internal Medicine

## 2023-02-06 DIAGNOSIS — E039 Hypothyroidism, unspecified: Secondary | ICD-10-CM

## 2023-02-07 NOTE — Telephone Encounter (Signed)
Requested Prescriptions  Pending Prescriptions Disp Refills   levothyroxine (SYNTHROID) 25 MCG tablet [Pharmacy Med Name: LEVOTHYROXINE 25 MCG TABLET] 135 tablet 0    Sig: TAKE 1 AND 1/2 TABLETS BY MOUTH DAILY( 30 MINUTES PRIOR TO FOOD OR DRINK)     Endocrinology:  Hypothyroid Agents Passed - 02/06/2023 10:42 AM      Passed - TSH in normal range and within 360 days    TSH  Date Value Ref Range Status  07/06/2022 0.924 0.450 - 4.500 uIU/mL Final  07/06/2022 0.997 0.450 - 4.500 uIU/mL Final         Passed - Valid encounter within last 12 months    Recent Outpatient Visits           6 months ago Hypothyroidism, unspecified type   [facility] Margarita Mail, DO   1 year ago Acute non-recurrent maxillary sinusitis   [facility] Alba Cory, MD   1 year ago Acute non-recurrent maxillary sinusitis   [facility] Margarita Mail, DO   1 year ago Hypothyroidism, unspecified type   [facility] Margarita Mail, DO   1 year ago Hypothyroidism, unspecified type   [facility] Margarita Mail, DO       Future Appointments             In 5 months Margarita Mail, DO [facility], [facility]

## 2023-03-11 NOTE — Progress Notes (Unsigned)
Established Patient Office Visit  Subjective:  Patient ID: Marie Griffin, female    DOB: 03-06-1961  Age: 62 y.o. MRN: [id number]  CC:  No chief complaint on file.   HPI Marie Griffin presents for follow-up on chronic medical conditions and to discuss behavorial health ?  Hypothyroidism: -Medications: Levothyroxine 25 mcg 1.5 tablets a day, Cytomel 25 mg 0.5 tablets - had been treated by a holistic provider in the past and was on this regimen for year and had been doing well.  -Patient is compliant with the above medication (s) at the above dose and reports no medication side effects.  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 -Had 3 UTI's since last visit, taking Macrobid, no symptoms now. Needs Macrobid refilled.   Hair Loss: -Curently taking Finasteride, seeing Dermatology, no changes.    At LOV was diagnosed with otitis media and acute sinus infection, treated with Augmentin but still having some decreased hearing on right side. No history of ear tubes, other procedures or recurrent ear infections. No sinus symptoms currently.   Health Maintenance: -Blood work up-to-date, reviewed most recent labs  -Mammogram due, history of biopsy 2019 left breast -mammogram ordered at her last office visit but not yet obtained -Pap 2019, negative, no history of abnormal Paps, will follow with gyn -Colon cancer screening:colonoscopy 2018, repeat in 10 years     Past Medical History:  Diagnosis Date   Anemia    H/O   H/O hypercalcemia    Hypothyroid     Past Surgical History:  Procedure Laterality Date   BREAST  BIOPSY Left 01/29/2018   2 AREAS- [facility]   BREAST CYST ASPIRATION Right 2009   neg   CESAREAN SECTION  1992 and 1994   x2   CHOLECYSTECTOMY N/A 07/20/2016   Procedure: LAPAROSCOPIC CHOLECYSTECTOMY;  Surgeon: Lattie Haw, MD;  Location: [facility] ORS;  Service: General;  Laterality: N/A;   DILATION AND CURETTAGE OF UTERUS  1990    Family History  Problem Relation Age of Onset   Diabetes Mother    Heart disease Mother    Heart disease Father    Alzheimer's disease Father    Breast cancer Neg Hx     Social History   Socioeconomic History   Marital status: Married    Spouse name: Not on file   Number of children: Not on file   Years of education: Not on file   Highest education level: Not on file  Occupational History   Not on file  Tobacco Use   Smoking status: Never   Smokeless tobacco: Never  Vaping Use   Vaping status: Never Used  Substance and Sexual Activity   Alcohol use: Yes    Comment: Occasional   Drug use: No   Sexual activity: Not Currently  Other Topics Concern   Not on file  Social History Narrative   Not on file   Social Determinants of Health   Financial Resource Strain: Not on file  Food Insecurity: Not on file  Transportation Needs: Not on file  Physical Activity: Not on file  Stress: Not on file  Social Connections: Not on file  Intimate Partner Violence: Not on file    ROS Review of Systems  Constitutional:  Negative for chills and fever.  HENT:  Positive for hearing loss. Negative for congestion, ear discharge, ear pain, sinus pressure, sinus pain and sore throat.   Eyes:  Negative for visual disturbance.  Respiratory:  Negative for cough and shortness of breath.   Cardiovascular:  Negative for chest pain.  Neurological:  Negative for dizziness and headaches.    Objective:   Today's Vitals: There were no vitals taken for this visit.  Physical Exam Constitutional:      Appearance: Normal appearance.  HENT:     Head: Normocephalic  and atraumatic.     Right Ear: Ear canal and external ear normal.     Left Ear: Tympanic membrane, ear canal and external ear normal.     Ears:     Comments: Tympanosclerosis present in right TM    Nose: Nose normal.     Mouth/Throat:     Mouth: Mucous membranes are moist.     Pharynx: Oropharynx is clear.  Eyes:     Conjunctiva/sclera: Conjunctivae normal.  Cardiovascular:     Rate and Rhythm: Normal rate and regular rhythm.  Pulmonary:     Effort: Pulmonary effort is normal.     Breath sounds: Normal breath sounds.  Skin:    General: Skin is warm and dry.  Neurological:     General: No focal deficit present.     Mental Status: She is alert. Mental status is at baseline.  Psychiatric:        Mood and Affect: Mood normal.        Behavior: Behavior normal.     Assessment & Plan:   1. Hypothyroidism, unspecified type: TSH and thyroid hormones normal from earlier this month, no changes to regimen. Continue Levothyroxine 25 mcg 1.5 tablets a day, Cytomel 25 mg 0.5 tablets.  2. Tympanosclerosis of right ear/Hearing decreased, right: Had been treated with antibiotics previously, now having changes in hearing. Referral to ENT placed.   - Ambulatory referral to ENT  3. History of recurrent UTIs: Takes prophylactic antibiotics for UTIs, Macrobid refilled.  - nitrofurantoin (MACRODANTIN) 100 MG capsule; Take 1 capsule (100 mg total) by mouth 2 (two) times daily as needed (UTI).  Dispense: 30 capsule; Refill: 0  4. Encounter for screening mammogram for malignant neoplasm of breast: Mammogram ordered.   - MM 3D SCREEN BREAST BILATERAL; Future   Follow-up: No follow-ups on file.   Margarita Mail, DO

## 2023-03-12 ENCOUNTER — Ambulatory Visit (INDEPENDENT_AMBULATORY_CARE_PROVIDER_SITE_OTHER): Payer: 59 | Admitting: Internal Medicine

## 2023-03-12 ENCOUNTER — Encounter: Payer: Self-pay | Admitting: Internal Medicine

## 2023-03-12 VITALS — BP 130/82 | HR 92 | Temp 97.9°F | Ht 63.0 in | Wt 133.0 lb

## 2023-03-12 DIAGNOSIS — F419 Anxiety disorder, unspecified: Secondary | ICD-10-CM | POA: Diagnosis not present

## 2023-03-12 MED ORDER — BUPROPION HCL ER (XL) 150 MG PO TB24
150.0000 mg | ORAL_TABLET | Freq: Every day | ORAL | 1 refills | Status: DC
Start: 2023-03-12 — End: 2023-04-05

## 2023-03-12 NOTE — Patient Instructions (Signed)
Bupropion Extended-Release Tablets (Depression/Mood Disorders) What is this medication? BUPROPION (byoo PROE pee on) treats depression. It increases norepinephrine and dopamine in the brain, hormones that help regulate mood. It belongs to a group of medications called NDRIs. This medicine may be used for other purposes; ask your health care provider or pharmacist if you have questions. COMMON BRAND NAME(S): Aplenzin, Budeprion XL, Forfivo XL, Wellbutrin XL What should I tell my care team before I take this medication? They need to know if you have any of these conditions: An eating disorder, such as anorexia or bulimia Bipolar disorder or psychosis Diabetes or high blood sugar, treated with medication Glaucoma Head injury or brain tumor Heart disease, previous heart attack, or irregular heart beat High blood pressure Kidney disease Liver disease Seizures Suicidal thoughts, plans, or attempt by you or a family member Tourette syndrome Weight loss An unusual or allergic reaction to bupropion, other medications, foods, dyes, or preservatives Pregnant or trying to become pregnant Breastfeeding How should I use this medication? Take this medication by mouth with a glass of water. Follow the directions on the prescription label. You can take it with or without food. If it upsets your stomach, take it with food. Do not crush, chew, or cut these tablets. This medication is taken once daily at the same time each day. Do not take your medication more often than directed. Do not stop taking this medication suddenly except upon the advice of your care team. Stopping this medication too quickly may cause serious side effects or your condition may worsen. A special MedGuide will be given to you by the pharmacist with each prescription and refill. Be sure to read this information carefully each time. Talk to your care team about the use of this medication in children. Special care may be  needed. Overdosage: If you think you have taken too much of this medicine contact a poison control center or emergency room at once. NOTE: This medicine is only for you. Do not share this medicine with others. What if I miss a dose? If you miss a dose, skip the missed dose and take your next tablet at the regular time. Do not take double or extra doses. What may interact with this medication? Do not take this medication with any of the following: Linezolid MAOIs, such as Azilect, Carbex, Eldepryl, Marplan, Nardil, and Parnate Methylene blue (injected into a vein) Other medications that contain bupropion, such as Zyban This medication may also interact with the following: Alcohol Certain medications for anxiety or sleep Certain medications for blood pressure, such as metoprolol, propranolol Certain medications for HIV or AIDS, such as efavirenz, lopinavir, nelfinavir, ritonavir Certain medications for irregular heartbeat, such as propafenone, flecainide Certain medications for mental health conditions Certain medications for Parkinson disease, such as amantadine, levodopa Certain medications for seizures, such as carbamazepine, phenytoin, phenobarbital Cimetidine Clopidogrel Cyclophosphamide Digoxin Furazolidone Isoniazid Nicotine Orphenadrine Procarbazine Steroid medications, such as prednisone or cortisone Stimulant medications for attention disorders, weight loss, or to stay awake Tamoxifen Theophylline Thiotepa Ticlopidine Tramadol Warfarin This list may not describe all possible interactions. Give your health care provider a list of all the medicines, herbs, non-prescription drugs, or dietary supplements you use. Also tell them if you smoke, drink alcohol, or use illegal drugs. Some items may interact with your medicine. What should I watch for while using this medication? Tell your care team if your symptoms do not get better or if they get worse. Visit your care team for  regular checks  on your progress. Because it may take several weeks to see the full effects of this medication, it is important to continue your treatment as prescribed. This medication may cause thoughts of suicide or depression. This includes sudden changes in mood, behaviors, or thoughts. These changes can happen at any time but are more common in the beginning of treatment or after a change in dose. Call your care team right away if you experience these thoughts or worsening depression. This medication may cause mood and behavior changes, such as anxiety, nervousness, irritability, hostility, restlessness, excitability, hyperactivity, or trouble sleeping. These changes can happen at any time but are more common in the beginning of treatment or after a change in dose. Call your care team right away if you notice any of these symptoms. This medication may cause serious skin reactions. They can happen weeks to months after starting the medication. Contact your care team right away if you notice fevers or flu-like symptoms with a rash. The rash may be red or purple and then turn into blisters or peeling of the skin. You may also notice a red rash with swelling of the face, lips, or lymph nodes in your neck or under your arms. Avoid drinks that contain alcohol while taking this medication. Drinking large amounts of alcohol, using sleeping or anxiety medications, or quickly stopping the use of these agents while taking this medication may increase your risk for a seizure. This medication may affect your coordination, reaction time, or judgment. Do not drive or operate machinery until you know how this medication affects you. Do not take this medication close to bedtime. It may prevent you from sleeping. Your mouth may get dry. Chewing sugarless gum or sucking hard candy and drinking plenty of water may help. Contact your care team if the problem does not go away or is severe. The tablet shell for some brands of  this medication does not dissolve. This is normal. The tablet shell may appear whole in the stool. This is not a cause for concern. What side effects may I notice from receiving this medication? Side effects that you should report to your care team as soon as possible: Allergic reactions--skin rash, itching, hives, swelling of the face, lips, tongue, or throat Increase in blood pressure Mood and behavior changes--anxiety, nervousness, confusion, hallucinations, irritability, hostility, thoughts of suicide or self-harm, worsening mood, feelings of depression Redness, blistering, peeling, or loosening of the skin, including inside the mouth Seizures Sudden eye pain or change in vision such as blurry vision, seeing halos around lights, vision loss Side effects that usually do not require medical attention (report to your care team if they continue or are bothersome): Constipation Dizziness Dry mouth Loss of appetite Nausea Tremors or shaking Trouble sleeping This list may not describe all possible side effects. Call your doctor for medical advice about side effects. You may report side effects to FDA at 1-800-FDA-1088. Where should I keep my medication? Keep out of the reach of children and pets. Store at room temperature between 15 and 30 degrees C (59 and 86 degrees F). Throw away any unused medication after the expiration date. NOTE: This sheet is a summary. It may not cover all possible information. If you have questions about this medicine, talk to your doctor, pharmacist, or health care provider.  2024 Elsevier/Gold Standard (2022-03-25 00:00:00)

## 2023-04-03 ENCOUNTER — Other Ambulatory Visit: Payer: Self-pay | Admitting: Internal Medicine

## 2023-04-03 DIAGNOSIS — F419 Anxiety disorder, unspecified: Secondary | ICD-10-CM

## 2023-04-04 NOTE — Telephone Encounter (Signed)
Requested medication (s) are due for refill today: Yes  Requested medication (s) are on the active medication list: Yes  Last refill:  03/12/23  Future visit scheduled: Yes  Notes to clinic:  Unable to refill per protocol due to diagnosis code needed.      Requested Prescriptions  Pending Prescriptions Disp Refills   buPROPion (WELLBUTRIN XL) 150 MG 24 hr tablet [Pharmacy Med Name: BUPROPION HCL XL 150 MG TABLET] 90 tablet 1    Sig: TAKE 1 TABLET BY MOUTH EVERY DAY     Psychiatry: Antidepressants - bupropion Passed - 04/03/2023  1:31 PM      Passed - Cr in normal range and within 360 days    Creatinine  Date Value Ref Range Status  01/30/2014 0.82 0.60 - 1.30 mg/dL Final   Creatinine, Ser  Date Value Ref Range Status  07/06/2022 0.78 0.57 - 1.00 mg/dL Final         Passed - AST in normal range and within 360 days    AST  Date Value Ref Range Status  07/06/2022 22 0 - 40 IU/L Final   SGOT(AST)  Date Value Ref Range Status  02/01/2014 133 (H) 15 - 37 Unit/L Final         Passed - ALT in normal range and within 360 days    ALT  Date Value Ref Range Status  07/06/2022 14 0 - 32 IU/L Final   SGPT (ALT)  Date Value Ref Range Status  02/01/2014 270 (H) 12 - 78 U/L Final         Passed - Last BP in normal range    BP Readings from Last 1 Encounters:  03/12/23 130/82         Passed - Valid encounter within last 6 months    Recent Outpatient Visits           3 weeks ago Anxiety   [facility] Margarita Mail, DO   8 months ago Hypothyroidism, unspecified type   [facility] Margarita Mail, DO   1 year ago Acute non-recurrent maxillary sinusitis   [facility] Alba Cory, MD   1 year ago Acute non-recurrent maxillary sinusitis   [facility] Margarita Mail, DO   1 year ago Hypothyroidism, unspecified type   [facility]  Margarita Mail, DO       Future Appointments             In 2 weeks Margarita Mail, DO [facility]   In 3 months Margarita Mail, DO [facility], [facility]

## 2023-04-22 NOTE — Progress Notes (Unsigned)
Established Patient Office Visit  Subjective:  Patient ID: Marie Griffin, female    DOB: 1961-07-12  Age: 62 y.o. MRN: [id number]  CC:  No chief complaint on file.   HPI Marie Griffin presents to discuss recent anxiety.   Anxiety/MDD: dealing with personal stress causing anxiety, irritability  -Mood status: uncontrolled -Current treatment: Started on Wellbutrin 150 mg XL at last office visit Previous psychiatric medications: None Depressed mood:  occasionally Anxious mood: yes Anhedonia: no Significant weight loss or gain: no Fatigue: yes     03/12/2023    9:36 AM 07/13/2022    2:49 PM 02/02/2022    2:59 PM 01/25/2022    2:19 PM 01/09/2022    2:26 PM  Depression screen PHQ 2/9  Decreased Interest 2 0 0 0 0  Down, Depressed, Hopeless 2 0 0 0 0  PHQ - 2 Score 4 0 0 0 0  Altered sleeping 0 0 0 0 0  Tired, decreased energy 2 0 0 0 0  Change in appetite 2 0 0 0 0  Feeling bad or failure about yourself  0 0 0 0 0  Trouble concentrating 2 0 0 0 0  Moving slowly or fidgety/restless 0 0 0 0 0  Suicidal thoughts 0 0 0 0 0  PHQ-9 Score 10 0 0 0 0  Difficult doing work/chores  Not difficult at all  Not difficult at all Not difficult at all      03/12/2023    9:41 AM  GAD 7 : Generalized Anxiety Score  Nervous, Anxious, on Edge 3  Control/stop worrying 2  Worry too much - different things 0  Trouble relaxing 2  Restless 2  Easily annoyed or irritable 3  Afraid - awful might happen 2  Total GAD 7 Score 14  Anxiety Difficulty Somewhat difficult      Past Medical History:  Diagnosis Date   Anemia    H/O   H/O hypercalcemia    Hypothyroid     Past Surgical History:  Procedure Laterality Date   BREAST BIOPSY Left 01/29/2018   2 AREAS- [facility]   BREAST CYST ASPIRATION Right 2009   neg   CESAREAN SECTION  1992 and 1994   x2   CHOLECYSTECTOMY N/A 07/20/2016   Procedure: LAPAROSCOPIC CHOLECYSTECTOMY;  Surgeon: Lattie Haw, MD;  Location: [facility] ORS;  Service: General;   Laterality: N/A;   DILATION AND CURETTAGE OF UTERUS  1990    Family History  Problem Relation Age of Onset   Diabetes Mother    Heart disease Mother    Heart disease Father    Alzheimer's disease Father    Breast cancer Neg Hx     Social History   Socioeconomic History   Marital status: Married    Spouse name: Not on file   Number of children: Not on file   Years of education: Not on file   Highest education level: Not on file  Occupational History   Not on file  Tobacco Use   Smoking status: Never   Smokeless tobacco: Never  Vaping Use   Vaping status: Never Used  Substance and Sexual Activity   Alcohol use: Yes    Comment: Occasional   Drug use: No   Sexual activity: Not Currently  Other Topics Concern   Not on file  Social History Narrative   Not on file   Social Determinants of Health   Financial Resource Strain: Not on file  Food Insecurity: Not on file  Transportation Needs: Not on file  Physical Activity: Not on file  Stress: Not on file  Social Connections: Not on file  Intimate Partner Violence: Not on file    ROS Review of Systems  Psychiatric/Behavioral:  The patient is nervous/anxious.     Objective:   Today's Vitals: There were no vitals taken for this visit.  Physical Exam Constitutional:      Appearance: Normal appearance.  HENT:     Head: Normocephalic and atraumatic.  Eyes:     Conjunctiva/sclera: Conjunctivae normal.  Cardiovascular:     Rate and Rhythm: Normal rate and regular rhythm.  Pulmonary:     Effort: Pulmonary effort is normal.     Breath sounds: Normal breath sounds.  Skin:    General: Skin is warm and dry.  Neurological:     General: No focal deficit present.     Mental Status: She is alert. Mental status is at baseline.  Psychiatric:        Mood and Affect: Mood normal.        Behavior: Behavior normal.     Assessment & Plan:   1. Anxiety: As mood changes are more situational I do think counseling or  therapy could be beneficial, patient states she will think about it. Concerned about weight gain side effects, will start Wellbutrin 150 mg XL as it is weight neutral. Potential side effects discussed, will follow up in 6 weeks to recheck.  - buPROPion (WELLBUTRIN XL) 150 MG 24 hr tablet; Take 1 tablet (150 mg total) by mouth daily.  Dispense: 30 tablet; Refill: 1   Follow-up: No follow-ups on file.   Margarita Mail, DO

## 2023-04-23 ENCOUNTER — Ambulatory Visit (INDEPENDENT_AMBULATORY_CARE_PROVIDER_SITE_OTHER): Payer: 59 | Admitting: Internal Medicine

## 2023-04-23 ENCOUNTER — Encounter: Payer: Self-pay | Admitting: Internal Medicine

## 2023-04-23 VITALS — BP 126/74 | HR 78 | Temp 97.7°F | Resp 16 | Ht 63.0 in | Wt 134.1 lb

## 2023-04-23 DIAGNOSIS — F419 Anxiety disorder, unspecified: Secondary | ICD-10-CM

## 2023-04-23 DIAGNOSIS — Z1239 Encounter for other screening for malignant neoplasm of breast: Secondary | ICD-10-CM | POA: Diagnosis not present

## 2023-04-23 DIAGNOSIS — E039 Hypothyroidism, unspecified: Secondary | ICD-10-CM

## 2023-04-23 MED ORDER — BUPROPION HCL ER (XL) 150 MG PO TB24: 150.0000 mg | ORAL_TABLET | Freq: Every day | ORAL | 1 refills | Status: DC

## 2023-04-23 MED ORDER — LEVOTHYROXINE SODIUM 25 MCG PO TABS: ORAL_TABLET | ORAL | 1 refills | Status: DC

## 2023-05-09 ENCOUNTER — Other Ambulatory Visit: Payer: Self-pay | Admitting: Internal Medicine

## 2023-05-09 DIAGNOSIS — E039 Hypothyroidism, unspecified: Secondary | ICD-10-CM

## 2023-05-10 NOTE — Telephone Encounter (Signed)
Requested Prescriptions  Pending Prescriptions Disp Refills   liothyronine (CYTOMEL) 25 MCG tablet [Pharmacy Med Name: LIOTHYRONINE SOD 25 MCG TAB] 90 tablet 0    Sig: TAKE 1 TABLET DAILY ON AN EMPTY STOMACH WITH A GLASS OF WATER AT LEAST 30-60 MINS BEFORE BREAKFAST.     Endocrinology:  Hypothyroid Agents Passed - 05/09/2023  2:30 PM      Passed - TSH in normal range and within 360 days    TSH  Date Value Ref Range Status  07/06/2022 0.924 0.450 - 4.500 uIU/mL Final  07/06/2022 0.997 0.450 - 4.500 uIU/mL Final         Passed - Valid encounter within last 12 months    Recent Outpatient Visits           2 weeks ago Anxiety   [facility] Margarita Mail, DO   1 month ago Anxiety   [facility] Margarita Mail, DO   10 months ago Hypothyroidism, unspecified type   [facility] Margarita Mail, DO   1 year ago Acute non-recurrent maxillary sinusitis   [facility] Alba Cory, MD   1 year ago Acute non-recurrent maxillary sinusitis   [facility] Margarita Mail, DO       Future Appointments             In 2 months Margarita Mail, DO [facility], [facility]

## 2023-07-15 ENCOUNTER — Ambulatory Visit (INDEPENDENT_AMBULATORY_CARE_PROVIDER_SITE_OTHER): Payer: 59 | Admitting: Internal Medicine

## 2023-07-15 ENCOUNTER — Encounter: Payer: Self-pay | Admitting: Internal Medicine

## 2023-07-15 ENCOUNTER — Other Ambulatory Visit: Payer: Self-pay

## 2023-07-15 VITALS — BP 120/68 | HR 97 | Temp 98.2°F | Ht 63.0 in | Wt 135.2 lb

## 2023-07-15 DIAGNOSIS — E559 Vitamin D deficiency, unspecified: Secondary | ICD-10-CM

## 2023-07-15 DIAGNOSIS — E2839 Other primary ovarian failure: Secondary | ICD-10-CM | POA: Diagnosis not present

## 2023-07-15 DIAGNOSIS — Z13820 Encounter for screening for osteoporosis: Secondary | ICD-10-CM

## 2023-07-15 DIAGNOSIS — Z0000 Encounter for general adult medical examination without abnormal findings: Secondary | ICD-10-CM

## 2023-07-15 DIAGNOSIS — Z87440 Personal history of urinary (tract) infections: Secondary | ICD-10-CM

## 2023-07-15 MED ORDER — NITROFURANTOIN MACROCRYSTAL 100 MG PO CAPS: 100.0000 mg | ORAL_CAPSULE | Freq: Two times a day (BID) | ORAL | 1 refills | Status: DC | PRN

## 2023-07-15 NOTE — Progress Notes (Signed)
Name: Marie Griffin   MRN: [id number]    DOB: Oct 24, 1960   Date:07/15/2023       Progress Note  Subjective  Chief Complaint  Chief Complaint  Patient presents with   Annual Exam    HPI  Patient presents for annual CPE.  Diet: Regular Exercise: None  Last Eye Exam:  scheduled for January 2025 Last Dental Exam: completed  Flowsheet Row Office Visit from 07/15/2023 in [facility]  AUDIT-C Score 1      Depression: Phq 9 is  negative    07/15/2023    3:07 PM 04/23/2023   10:32 AM 03/12/2023    9:36 AM 07/13/2022    2:49 PM 02/02/2022    2:59 PM  Depression screen PHQ 2/9  Decreased Interest 0 0 2 0 0  Down, Depressed, Hopeless 0 0 2 0 0  PHQ - 2 Score 0 0 4 0 0  Altered sleeping  1 0 0 0  Tired, decreased energy  0 2 0 0  Change in appetite  0 2 0 0  Feeling bad or failure about yourself   0 0 0 0  Trouble concentrating  0 2 0 0  Moving slowly or fidgety/restless  0 0 0 0  Suicidal thoughts  0 0 0 0  PHQ-9 Score  1 10 0 0  Difficult doing work/chores    Not difficult at all    Hypertension: BP Readings from Last 3 Encounters:  07/15/23 120/68  04/23/23 126/74  03/12/23 130/82   Obesity: Wt Readings from Last 3 Encounters:  07/15/23 135 lb 3.2 oz (61.3 kg)  04/23/23 134 lb 1.6 oz (60.8 kg)  03/12/23 133 lb (60.3 kg)   BMI Readings from Last 3 Encounters:  07/15/23 23.95 kg/m  04/23/23 23.75 kg/m  03/12/23 23.56 kg/m     Vaccines: - declined all vaccines  RSV: encouraged to get at the pharmacy HPV: not applicable Tdap: declined Shingrix: declined Pneumonia: declined Flu: declined COVID-19: declined  Hep C Screening: completed  STD testing and prevention (HIV/chl/gon/syphilis): no concerns Intimate partner violence: negative screen  LMP: at age 89, no vaginal bleeding since then  Discussed importance of follow up if any post-menopausal bleeding: no  Incontinence Symptoms: negative for symptoms   Breast cancer:  - Last  Mammogram: 06/30/2018; patient does not want to continue with mammograms, discussed in depth  Osteoporosis Prevention : Discussed high calcium and vitamin D supplementation, weight bearing exercises Bone density: ordered, patient's mother had osteoporosis   Cervical cancer screening: patient declined - last in 2019, discussed at length   Skin cancer: Discussed monitoring for atypical lesions  Colorectal cancer:   07/01/2017, repeat in 10 years Lung cancer:  Low Dose CT Chest recommended if Age 45-80 years, 20 pack-year currently smoking OR have quit w/in 15years. Patient does not qualify for screen   ECG: 01/29/2014  Advanced Care Planning: A voluntary discussion about advance care planning including the explanation and discussion of advance directives.  Discussed health care proxy and Living will, and the patient was able to identify a health care proxy as Iselle Sidwell (husband).  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   Cholecystitis    Thyroid disease 07/10/2016    Past Surgical History:  Procedure Laterality Date   BREAST BIOPSY Left 01/29/2018   2 AREAS- [facility]  BREAST CYST ASPIRATION Right 2009   neg   CESAREAN SECTION  1992 and 1994   x2   CHOLECYSTECTOMY N/A 07/20/2016   Procedure: LAPAROSCOPIC CHOLECYSTECTOMY;  Surgeon: Lattie Haw, MD;  Location: [facility] ORS;  Service: General;  Laterality: N/A;   DILATION AND CURETTAGE OF UTERUS  1990    Family History  Problem Relation Age of Onset   Diabetes Mother    Heart disease Mother    Heart disease Father    Alzheimer's disease Father    Breast cancer Neg Hx     Social History   Socioeconomic History   Marital status: Married    Spouse name: Not on file   Number of children: Not on file   Years of education: Not on file   Highest education level: Not on file  Occupational History   Not  on file  Tobacco Use   Smoking status: Never   Smokeless tobacco: Never  Vaping Use   Vaping status: Never Used  Substance and Sexual Activity   Alcohol use: Yes    Comment: Occasional   Drug use: No   Sexual activity: Not Currently  Other Topics Concern   Not on file  Social History Narrative   Not on file   Social Drivers of Health   Financial Resource Strain: Low Risk  (07/15/2023)   Overall Financial Resource Strain (CARDIA)    Difficulty of Paying Living Expenses: Not hard at all  Food Insecurity: No Food Insecurity (07/15/2023)   Hunger Vital Sign    Worried About Running Out of Food in the Last Year: Never true    Ran Out of Food in the Last Year: Never true  Transportation Needs: No Transportation Needs (07/15/2023)   PRAPARE - Administrator, Civil Service (Medical): No    Lack of Transportation (Non-Medical): No  Physical Activity: Inactive (07/15/2023)   Exercise Vital Sign    Days of Exercise per Week: 0 days    Minutes of Exercise per Session: 0 min  Stress: No Stress Concern Present (07/15/2023)   [employer] of Occupational Health - Occupational Stress Questionnaire    Feeling of Stress : Not at all  Social Connections: Moderately Integrated (07/15/2023)   Social Connection and Isolation Panel [NHANES]    Frequency of Communication with Friends and Family: More than three times a week    Frequency of Social Gatherings with Friends and Family: More than three times a week    Attends Religious Services: 1 to 4 times per year    Active Member of [employer] or Organizations: No    Attends Banker Meetings: Never    Marital Status: Married  Catering manager Violence: Not At Risk (07/15/2023)   Humiliation, Afraid, Rape, and Kick questionnaire    Fear of Current or Ex-Partner: No    Emotionally Abused: No    Physically Abused: No    Sexually Abused: No     Current Outpatient Medications:    buPROPion (WELLBUTRIN XL) 150 MG 24 hr  tablet, Take 1 tablet (150 mg total) by mouth daily., Disp: 90 tablet, Rfl: 1   finasteride (PROSCAR) 5 MG tablet, Take 2.5 mg by mouth daily., Disp: , Rfl:    levothyroxine (SYNTHROID) 25 MCG tablet, TAKE 1 AND 1/2 TABLETS BY MOUTH DAILY( 30 MINUTES PRIOR TO FOOD OR DRINK), Disp: 135 tablet, Rfl: 1   liothyronine (CYTOMEL) 25 MCG tablet, TAKE 1 TABLET DAILY ON AN EMPTY STOMACH WITH A GLASS OF WATER  AT LEAST 30-60 MINS BEFORE BREAKFAST., Disp: 90 tablet, Rfl: 0   nitrofurantoin (MACRODANTIN) 100 MG capsule, Take 1 capsule (100 mg total) by mouth 2 (two) times daily as needed (UTI)., Disp: 30 capsule, Rfl: 0  Allergies  Allergen Reactions   Other Rash    DOGS, HORSES   Sulfa Antibiotics Rash    Lips swelling, rash     Review of Systems  All other systems reviewed and are negative.   Objective  Vitals:   07/15/23 1500  Pulse: 97  Temp: 98.2 F (36.8 C)  TempSrc: Oral  SpO2: 97%  Weight: 135 lb 3.2 oz (61.3 kg)  Height: 5\' 3"  (1.6 m)    Body mass index is 23.95 kg/m.  Physical Exam Constitutional:      Appearance: Normal appearance.  HENT:     Head: Normocephalic and atraumatic.     Mouth/Throat:     Mouth: Mucous membranes are moist.     Pharynx: Oropharynx is clear.  Eyes:     Extraocular Movements: Extraocular movements intact.     Conjunctiva/sclera: Conjunctivae normal.     Pupils: Pupils are equal, round, and reactive to light.  Neck:     Comments: No thyromegaly  Cardiovascular:     Rate and Rhythm: Normal rate and regular rhythm.  Pulmonary:     Effort: Pulmonary effort is normal.     Breath sounds: Normal breath sounds.  Musculoskeletal:     Cervical back: No tenderness.     Right lower leg: No edema.     Left lower leg: No edema.  Lymphadenopathy:     Cervical: No cervical adenopathy.  Skin:    General: Skin is warm and dry.  Neurological:     General: No focal deficit present.     Mental Status: She is alert. Mental status is at baseline.   Psychiatric:        Mood and Affect: Mood normal.        Behavior: Behavior normal.     Last CBC Lab Results  Component Value Date   WBC 4.6 07/06/2022   HGB 12.3 07/06/2022   HCT 37.4 07/06/2022   MCV 91 07/06/2022   MCH 29.8 07/06/2022   RDW 11.7 07/06/2022   PLT 255 07/06/2022   Last metabolic panel Lab Results  Component Value Date   GLUCOSE 98 07/06/2022   NA 139 07/06/2022   K 4.7 07/06/2022   CL 102 07/06/2022   CO2 23 07/06/2022   BUN 12 07/06/2022   CREATININE 0.78 07/06/2022   EGFR 87 07/06/2022   CALCIUM 11.4 (H) 07/06/2022   PROT 7.3 07/06/2022   ALBUMIN 4.7 07/06/2022   LABGLOB 2.6 07/06/2022   AGRATIO 1.8 07/06/2022   BILITOT 0.5 07/06/2022   ALKPHOS 73 07/06/2022   AST 22 07/06/2022   ALT 14 07/06/2022   ANIONGAP 9 07/07/2016   Last lipids Lab Results  Component Value Date   CHOL 199 07/06/2022   HDL 81 07/06/2022   LDLCALC 109 (H) 07/06/2022   TRIG 46 07/06/2022   CHOLHDL 2.5 07/06/2022   Last hemoglobin A1c No results found for: "HGBA1C" Last thyroid functions Lab Results  Component Value Date   TSH 0.924 07/06/2022   TSH 0.997 07/06/2022   T4TOTAL 7.7 07/06/2022   Last vitamin D No results found for: "25OHVITD2", "25OHVITD3", "VD25OH" Last vitamin B12 and Folate No results found for: "VITAMINB12", "FOLATE"    Assessment & Plan  1. Annual physical exam (Primary)/Vitamin D deficiency: Physical exam  completed, health maintenance reviewed and annual labs ordered. Patient not interested in vaccines, mammogram or Pap, counseling provided.   - CBC w/Diff/Platelet - Comprehensive Metabolic Panel (CMET) - Lipid Profile - Thyroid Panel With TSH - Vitamin D (25 hydroxy)  2. Osteoporosis screening/Estrogen deficiency/Vitamin D deficiency: Bone scan ordered.   - DG Bone Density; Future  3. History of recurrent UTIs: Refill Macrobid, taking PRN to prevent infections.   - nitrofurantoin (MACRODANTIN) 100 MG capsule; Take 1 capsule  (100 mg total) by mouth 2 (two) times daily as needed (UTI).  Dispense: 30 capsule; Refill: 1   -USPSTF grade A and B recommendations reviewed with patient; age-appropriate recommendations, preventive care, screening tests, etc discussed and encouraged; healthy living encouraged; see AVS for patient education given to patient -Discussed importance of 150 minutes of physical activity weekly, eat two servings of fish weekly, eat one serving of tree nuts ( cashews, pistachios, pecans, almonds.Marland Kitchen) every other day, eat 6 servings of fruit/vegetables daily and drink plenty of water and avoid sweet beverages.   -Reviewed Health Maintenance: Yes.

## 2023-10-20 ENCOUNTER — Other Ambulatory Visit: Payer: Self-pay | Admitting: Internal Medicine

## 2023-10-20 DIAGNOSIS — E039 Hypothyroidism, unspecified: Secondary | ICD-10-CM

## 2023-10-22 NOTE — Telephone Encounter (Signed)
 Requested medication (s) are due for refill today: yes  Requested medication (s) are on the active medication list: yes  Last refill:  10/25(/24  Future visit scheduled: yes  Notes to clinic:  Unable to refill per protocol due to failed labs, no updated  TSH results.      Requested Prescriptions  Pending Prescriptions Disp Refills   liothyronine (CYTOMEL) 25 MCG tablet [Pharmacy Med Name: LIOTHYRONINE SOD 25 MCG TAB] 90 tablet 0    Sig: TAKE 1 TABLET DAILY ON AN EMPTY STOMACH WITH A GLASS OF WATER AT LEAST 30-60 MINS BEFORE BREAKFAST.     Endocrinology:  Hypothyroid Agents Failed - 10/22/2023  9:13 AM      Failed - TSH in normal range and within 360 days    TSH  Date Value Ref Range Status  07/06/2022 0.924 0.450 - 4.500 uIU/mL Final  07/06/2022 0.997 0.450 - 4.500 uIU/mL Final         Failed - Valid encounter within last 12 months    Recent Outpatient Visits   None     Future Appointments             In 8 months Margarita Mail, DO [place] [facility], [facility]

## 2023-11-03 ENCOUNTER — Other Ambulatory Visit: Payer: Self-pay | Admitting: Internal Medicine

## 2023-11-03 DIAGNOSIS — E039 Hypothyroidism, unspecified: Secondary | ICD-10-CM

## 2023-11-03 DIAGNOSIS — F419 Anxiety disorder, unspecified: Secondary | ICD-10-CM

## 2023-11-04 NOTE — Telephone Encounter (Signed)
Needs appt asap

## 2023-11-04 NOTE — Telephone Encounter (Signed)
 Requested medication (s) are due for refill today: yes  Requested medication (s) are on the active medication list: yes  Last refill:   both reordered 04/23/23 and given 6 month supply  Future visit scheduled: yes  Notes to clinic:  called pt and LM on VM that she had lab work ordered in December 2024 and those must be completed- routing back to provider.    Requested Prescriptions  Pending Prescriptions Disp Refills   buPROPion  (WELLBUTRIN  XL) 150 MG 24 hr tablet [Pharmacy Med Name: BUPROPION  HCL XL 150 MG TABLET] 90 tablet 1    Sig: TAKE 1 TABLET BY MOUTH EVERY DAY     Psychiatry: Antidepressants - bupropion  Failed - 11/04/2023  2:21 PM      Failed - Cr in normal range and within 360 days    Creatinine  Date Value Ref Range Status  01/30/2014 0.82 0.60 - 1.30 mg/dL Final   Creatinine, Ser  Date Value Ref Range Status  07/06/2022 0.78 0.57 - 1.00 mg/dL Final         Failed - AST in normal range and within 360 days    AST  Date Value Ref Range Status  07/06/2022 22 0 - 40 IU/L Final   SGOT(AST)  Date Value Ref Range Status  02/01/2014 133 (H) 15 - 37 Unit/L Final         Failed - ALT in normal range and within 360 days    ALT  Date Value Ref Range Status  07/06/2022 14 0 - 32 IU/L Final   SGPT (ALT)  Date Value Ref Range Status  02/01/2014 270 (H) 12 - 78 U/L Final         Failed - Valid encounter within last 6 months    Recent Outpatient Visits   None     Future Appointments             In 8 months Rockney Cid, DO [facility] [facility], [facility]            Passed - Last BP in normal range    BP Readings from Last 1 Encounters:  07/15/23 120/68          levothyroxine  (SYNTHROID ) 25 MCG tablet [Pharmacy Med Name: LEVOTHYROXINE  25 MCG TABLET] 135 tablet 1    Sig: TAKE 1 AND 1/2 TABLETS BY MOUTH DAILY( 30 MINUTES PRIOR TO FOOD OR DRINK)     Endocrinology:  Hypothyroid Agents Failed - 11/04/2023  2:21 PM      Failed - TSH in normal  range and within 360 days    TSH  Date Value Ref Range Status  07/06/2022 0.924 0.450 - 4.500 uIU/mL Final  07/06/2022 0.997 0.450 - 4.500 uIU/mL Final         Failed - Valid encounter within last 12 months    Recent Outpatient Visits   None     Future Appointments             In 8 months Rockney Cid, DO [place] [facility], [facility]

## 2023-11-05 NOTE — Telephone Encounter (Signed)
 Appt sch'd for 11/26/23

## 2023-11-26 ENCOUNTER — Ambulatory Visit: Admitting: Internal Medicine

## 2023-12-06 ENCOUNTER — Other Ambulatory Visit: Payer: Self-pay | Admitting: Internal Medicine

## 2023-12-06 DIAGNOSIS — F419 Anxiety disorder, unspecified: Secondary | ICD-10-CM

## 2023-12-07 ENCOUNTER — Other Ambulatory Visit: Payer: Self-pay | Admitting: Internal Medicine

## 2023-12-07 DIAGNOSIS — E039 Hypothyroidism, unspecified: Secondary | ICD-10-CM

## 2023-12-10 NOTE — Telephone Encounter (Signed)
 Requested medications are due for refill today.  yes  Requested medications are on the active medications list.  yes  Last refill. 11/04/2023 #30 0 rf  Future visit scheduled.   yes  Notes to clinic.  Labs are expired.    Requested Prescriptions  Pending Prescriptions Disp Refills   buPROPion  (WELLBUTRIN  XL) 150 MG 24 hr tablet [Pharmacy Med Name: BUPROPION  HCL XL 150 MG TABLET] 90 tablet 1    Sig: TAKE 1 TABLET BY MOUTH EVERY DAY     Psychiatry: Antidepressants - bupropion  Failed - 12/10/2023 12:28 PM      Failed - Cr in normal range and within 360 days    Creatinine  Date Value Ref Range Status  01/30/2014 0.82 0.60 - 1.30 mg/dL Final   Creatinine, Ser  Date Value Ref Range Status  07/06/2022 0.78 0.57 - 1.00 mg/dL Final         Failed - AST in normal range and within 360 days    AST  Date Value Ref Range Status  07/06/2022 22 0 - 40 IU/L Final   SGOT(AST)  Date Value Ref Range Status  02/01/2014 133 (H) 15 - 37 Unit/L Final         Failed - ALT in normal range and within 360 days    ALT  Date Value Ref Range Status  07/06/2022 14 0 - 32 IU/L Final   SGPT (ALT)  Date Value Ref Range Status  02/01/2014 270 (H) 12 - 78 U/L Final         Failed - Valid encounter within last 6 months    Recent Outpatient Visits   None     Future Appointments             In 7 months Rockney Cid, DO [facility], [facility]            Passed - Last BP in normal range    BP Readings from Last 1 Encounters:  07/15/23 120/68

## 2023-12-11 ENCOUNTER — Ambulatory Visit: Payer: Self-pay | Admitting: Internal Medicine

## 2023-12-11 LAB — CBC WITH DIFFERENTIAL/PLATELET
Basophils Absolute: 0.1 10*3/uL (ref 0.0–0.2)
Basos: 2 %
EOS (ABSOLUTE): 0.2 10*3/uL (ref 0.0–0.4)
Eos: 6 %
Hematocrit: 39.2 % (ref 34.0–46.6)
Hemoglobin: 12.7 g/dL (ref 11.1–15.9)
Immature Grans (Abs): 0 10*3/uL (ref 0.0–0.1)
Immature Granulocytes: 0 %
Lymphocytes Absolute: 1.2 10*3/uL (ref 0.7–3.1)
Lymphs: 34 %
MCH: 30.1 pg (ref 26.6–33.0)
MCHC: 32.4 g/dL (ref 31.5–35.7)
MCV: 93 fL (ref 79–97)
Monocytes Absolute: 0.4 10*3/uL (ref 0.1–0.9)
Monocytes: 11 %
Neutrophils Absolute: 1.7 10*3/uL (ref 1.4–7.0)
Neutrophils: 47 %
Platelets: 272 10*3/uL (ref 150–450)
RBC: 4.22 x10E6/uL (ref 3.77–5.28)
RDW: 12.3 % (ref 11.7–15.4)
WBC: 3.6 10*3/uL (ref 3.4–10.8)

## 2023-12-11 LAB — COMPREHENSIVE METABOLIC PANEL WITH GFR
ALT: 16 IU/L (ref 0–32)
AST: 21 IU/L (ref 0–40)
Albumin: 4.6 g/dL (ref 3.8–4.9)
Alkaline Phosphatase: 60 IU/L (ref 44–121)
BUN/Creatinine Ratio: 12 (ref 12–28)
BUN: 9 mg/dL (ref 8–27)
Bilirubin Total: 0.5 mg/dL (ref 0.0–1.2)
CO2: 23 mmol/L (ref 20–29)
Calcium: 11.4 mg/dL — ABNORMAL HIGH (ref 8.7–10.3)
Chloride: 101 mmol/L (ref 96–106)
Creatinine, Ser: 0.73 mg/dL (ref 0.57–1.00)
Globulin, Total: 2.4 g/dL (ref 1.5–4.5)
Glucose: 91 mg/dL (ref 70–99)
Potassium: 4.5 mmol/L (ref 3.5–5.2)
Sodium: 138 mmol/L (ref 134–144)
Total Protein: 7 g/dL (ref 6.0–8.5)
eGFR: 94 mL/min/{1.73_m2} (ref >59)

## 2023-12-11 LAB — THYROID PANEL WITH TSH
Free Thyroxine Index: 1.4 (ref 1.2–4.9)
T3 Uptake Ratio: 23 % — ABNORMAL LOW (ref 24–39)
T4, Total: 6.1 ug/dL (ref 4.5–12.0)
TSH: 2.8 u[IU]/mL (ref 0.450–4.500)

## 2023-12-11 LAB — LIPID PANEL
Chol/HDL Ratio: 3.2 ratio (ref 0.0–4.4)
Cholesterol, Total: 208 mg/dL — ABNORMAL HIGH (ref 100–199)
HDL: 66 mg/dL (ref >39)
LDL Chol Calc (NIH): 127 mg/dL — ABNORMAL HIGH (ref 0–99)
Triglycerides: 84 mg/dL (ref 0–149)
VLDL Cholesterol Cal: 15 mg/dL (ref 5–40)

## 2023-12-11 LAB — VITAMIN D 25 HYDROXY (VIT D DEFICIENCY, FRACTURES): Vit D, 25-Hydroxy: 38.7 ng/mL (ref 30.0–100.0)

## 2023-12-11 NOTE — Telephone Encounter (Signed)
 Requested Prescriptions  Pending Prescriptions Disp Refills   levothyroxine  (SYNTHROID ) 25 MCG tablet [Pharmacy Med Name: LEVOTHYROXINE  25 MCG TABLET] 135 tablet 0    Sig: TAKE 1 AND 1/2 TABLETS BY MOUTH DAILY( 30 MINUTES PRIOR TO FOOD OR DRINK)     Endocrinology:  Hypothyroid Agents Failed - 12/11/2023  9:38 AM      Failed - Valid encounter within last 12 months    Recent Outpatient Visits   None     Future Appointments             In 7 months Rockney Cid, DO [place] [facility], [facility]            Passed - TSH in normal range and within 360 days    TSH  Date Value Ref Range Status  12/10/2023 2.800 0.450 - 4.500 uIU/mL Final

## 2023-12-13 ENCOUNTER — Encounter: Payer: Self-pay | Admitting: Internal Medicine

## 2023-12-13 ENCOUNTER — Other Ambulatory Visit: Payer: Self-pay

## 2023-12-13 ENCOUNTER — Ambulatory Visit (INDEPENDENT_AMBULATORY_CARE_PROVIDER_SITE_OTHER): Admitting: Internal Medicine

## 2023-12-13 VITALS — BP 120/72 | HR 93 | Temp 97.9°F | Resp 16 | Ht 63.0 in | Wt 131.0 lb

## 2023-12-13 DIAGNOSIS — E039 Hypothyroidism, unspecified: Secondary | ICD-10-CM | POA: Diagnosis not present

## 2023-12-13 MED ORDER — LIOTHYRONINE SODIUM 25 MCG PO TABS: ORAL_TABLET | ORAL | 1 refills | Status: DC

## 2023-12-13 MED ORDER — LEVOTHYROXINE SODIUM 25 MCG PO TABS: ORAL_TABLET | ORAL | 1 refills | Status: AC

## 2023-12-13 NOTE — Progress Notes (Signed)
 Established Patient Office Visit  Subjective:  Patient ID: Marie Griffin, female    DOB: [birth year]  Age: 63 y.o. MRN: [id number]  CC:  Chief Complaint  Patient presents with   Medical Management of Chronic Issues    HPI Marie Griffin presents to follow up on chronic medical conditions.   Discussed the use of AI scribe software for clinical note transcription with the patient, who gave verbal consent to proceed.  History of Present Illness Marie Griffin is a 63 year old female who presents for a follow-up visit after recent blood work showed elevated calcium levels.  She has a chronic issue with elevated calcium levels, consistent with her baseline. Other electrolytes and kidney function are normal. Her father also has a related condition.  Thyroid  function tests show a slightly low T3 uptake ratio, with normal TSH and T4 levels. She takes levothyroxine  25 micrograms and liothyronine . A recent change in her Synthroid  manufacturer may require future monitoring.  She stopped taking Wellbutrin  at the beginning of the year and is currently taking finasteride for her hair. She has not filled her nitrofurantoin  prescription since December but believes a refill is available.  Hypothyroidism: -Medications: Levothyroxine  25 mcg 1.5 tablets a day, Cytomel  25 mg 0.5 tablets - had been treated by a holistic provider in the past and was on this regimen for year and had been doing well.  -Patient is compliant with the above medication (s) at the above dose and reports no medication side effects.  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 Patient did not want to proceed further at that time and does not want a repeat mammogram but willing to do ultrasounds.    Past Medical History:  Diagnosis Date   Anemia    H/O    H/O hypercalcemia    Hypothyroid     Past Surgical History:  Procedure Laterality Date   BREAST BIOPSY Left 01/29/2018   2 AREAS- [facility]   BREAST CYST ASPIRATION Right 2009   neg   CESAREAN SECTION  1992 and 1994   x2   CHOLECYSTECTOMY N/A 07/20/2016   Procedure: LAPAROSCOPIC CHOLECYSTECTOMY;  Surgeon: Claudia Cuff, MD;  Location: [facility] ORS;  Service: General;  Laterality: N/A;   DILATION AND CURETTAGE OF UTERUS  1990    Family History  Problem Relation Age of Onset   Diabetes Mother    Heart disease Mother    Heart disease Father    Alzheimer's disease Father    Breast cancer Neg Hx     Social History   Socioeconomic History   Marital status: Married    Spouse name: Not on file   Number of children: Not on file   Years of education: Not on file   Highest education level: Not on file  Occupational History   Not on file  Tobacco Use   Smoking status: Never   Smokeless tobacco: Never  Vaping Use   Vaping status: Never Used  Substance and Sexual Activity   Alcohol use: Yes    Comment: Occasional   Drug use: No   Sexual activity: Not Currently  Other Topics Concern   Not on file  Social History Narrative   Not on file   Social Drivers of Health   Financial Resource Strain: Low Risk  (07/15/2023)   Overall Financial Resource Strain (CARDIA)  Difficulty of Paying Living Expenses: Not hard at all  Food Insecurity: No Food Insecurity (07/15/2023)   Hunger Vital Sign    Worried About Running Out of Food in the Last Year: Never true    Ran Out of Food in the Last Year: Never true  Transportation Needs: No Transportation Needs (07/15/2023)   PRAPARE - Administrator, Civil Service (Medical): No    Lack of Transportation (Non-Medical): No  Physical Activity: Inactive (07/15/2023)   Exercise Vital Sign    Days of Exercise per Week: 0 days    Minutes of Exercise per Session: 0 min  Stress: No Stress Concern Present (07/15/2023)   [employer]  of Occupational Health - Occupational Stress Questionnaire    Feeling of Stress : Not at all  Social Connections: Moderately Integrated (07/15/2023)   Social Connection and Isolation Panel [NHANES]    Frequency of Communication with Friends and Family: More than three times a week    Frequency of Social Gatherings with Friends and Family: More than three times a week    Attends Religious Services: 1 to 4 times per year    Active Member of [employer] or Organizations: No    Attends Banker Meetings: Never    Marital Status: Married  Catering manager Violence: Not At Risk (07/15/2023)   Humiliation, Afraid, Rape, and Kick questionnaire    Fear of Current or Ex-Partner: No    Emotionally Abused: No    Physically Abused: No    Sexually Abused: No    ROS Review of Systems  Psychiatric/Behavioral:  The patient is not nervous/anxious.   All other systems reviewed and are negative.   Objective:   Today's Vitals: BP 120/72 (Cuff Size: Normal)   Pulse 93   Temp 97.9 F (36.6 C) (Oral)   Resp 16   Ht 5\' 3"  (1.6 m)   Wt 131 lb (59.4 kg)   SpO2 96%   BMI 23.21 kg/m   Physical Exam Constitutional:      Appearance: Normal appearance.  HENT:     Head: Normocephalic and atraumatic.  Eyes:     Conjunctiva/sclera: Conjunctivae normal.  Cardiovascular:     Rate and Rhythm: Normal rate and regular rhythm.  Pulmonary:     Effort: Pulmonary effort is normal.     Breath sounds: Normal breath sounds.  Skin:    General: Skin is warm and dry.  Neurological:     General: No focal deficit present.     Mental Status: She is alert. Mental status is at baseline.  Psychiatric:        Mood and Affect: Mood normal.        Behavior: Behavior normal.     Assessment & Plan:   Assessment & Plan Hypercalcemia Chronic hypercalcemia with familial pattern. Current calcium levels slightly elevated, consistent with baseline.  Hypothyroidism Well-managed on levothyroxine  and  liothyronine . TSH and T4 normal, T3 uptake slightly low. New T4 manufacturer, monitoring advised. - Refilled levothyroxine  for 6 months. - Refilled liothyronine  (Cytomel ) for 6 months. - Recheck thyroid  function tests in 6 months during physical exam.  - levothyroxine  (SYNTHROID ) 25 MCG tablet; TAKE 1 AND 1/2 TABLETS BY MOUTH DAILY( 30 MINUTES PRIOR TO FOOD OR DRINK)  Dispense: 135 tablet; Refill: 1 - liothyronine  (CYTOMEL ) 25 MCG tablet; TAKE 1 TABLET DAILY ON AN EMPTY STOMACH WITH A GLASS OF WATER AT LEAST 30-60 MINS BEFORE BREAKFAST.  Dispense: 90 tablet; Refill: 1   Follow-up: Return for already  scheduled.   Rockney Cid, DO

## 2024-06-22 ENCOUNTER — Other Ambulatory Visit: Payer: Self-pay | Admitting: Internal Medicine

## 2024-06-22 DIAGNOSIS — E039 Hypothyroidism, unspecified: Secondary | ICD-10-CM

## 2024-06-23 NOTE — Telephone Encounter (Signed)
 Requested Prescriptions  Pending Prescriptions Disp Refills   liothyronine  (CYTOMEL ) 25 MCG tablet [Pharmacy Med Name: LIOTHYRONINE  SOD 25 MCG TAB] 90 tablet 0    Sig: TAKE 1 TABLET DAILY ON AN EMPTY STOMACH WITH A GLASS OF WATER AT LEAST 30-60 MINS BEFORE BREAKFAST.     Endocrinology:  Hypothyroid Agents Passed - 06/23/2024  3:57 PM      Passed - TSH in normal range and within 360 days    TSH  Date Value Ref Range Status  12/10/2023 2.800 0.450 - 4.500 uIU/mL Final         Passed - Valid encounter within last 12 months    Recent Outpatient Visits           6 months ago Hypothyroidism, unspecified type   [facility] Bernardo Fend, DO       Future Appointments             In 3 weeks Bernardo Fend, DO [facility], [place]

## 2024-07-17 ENCOUNTER — Encounter: Payer: Self-pay | Admitting: Internal Medicine

## 2024-08-03 ENCOUNTER — Ambulatory Visit (INDEPENDENT_AMBULATORY_CARE_PROVIDER_SITE_OTHER): Admitting: Internal Medicine

## 2024-08-03 ENCOUNTER — Encounter: Payer: Self-pay | Admitting: Internal Medicine

## 2024-08-03 ENCOUNTER — Other Ambulatory Visit: Payer: Self-pay

## 2024-08-03 VITALS — BP 122/68 | HR 79 | Temp 98.0°F | Resp 16 | Ht 63.0 in | Wt 136.1 lb

## 2024-08-03 DIAGNOSIS — Z0000 Encounter for general adult medical examination without abnormal findings: Secondary | ICD-10-CM

## 2024-08-03 DIAGNOSIS — E039 Hypothyroidism, unspecified: Secondary | ICD-10-CM | POA: Diagnosis not present

## 2024-08-03 DIAGNOSIS — Z13220 Encounter for screening for lipoid disorders: Secondary | ICD-10-CM

## 2024-08-03 DIAGNOSIS — Z87440 Personal history of urinary (tract) infections: Secondary | ICD-10-CM | POA: Diagnosis not present

## 2024-08-03 MED ORDER — NITROFURANTOIN MACROCRYSTAL 100 MG PO CAPS: 100.0000 mg | ORAL_CAPSULE | Freq: Two times a day (BID) | ORAL | 1 refills | Status: AC | PRN

## 2024-08-03 NOTE — Progress Notes (Signed)
 Name: Marie Griffin   MRN: [id number]    DOB: Apr 11, 1961   Date:08/03/2024       Progress Note  Subjective  Chief Complaint  Chief Complaint  Patient presents with   Annual Exam    HPI  Patient presents for annual CPE.  Discussed the use of AI scribe software for clinical note transcription with the patient, who gave verbal consent to proceed.  History of Present Illness Marie Griffin is a 64 year old female who presents for routine blood work and an annual physical exam.  She last had thyroid  and annual labs in May of last year. She is not fasting today and plans to complete blood work at [facility].  She has a strong family history of heart disease and diabetes and is concerned about her own risk. She is interested in lipoprotein A testing and is considering an A1c due to her mother's diabetes.  She takes vitamin D  supplements intermittently and has been inconsistent recently.  She reports no concerns about sexually transmitted diseases or vaginal bleeding. Her last mammogram was in 2019 and her last colonoscopy was 10 years ago.  She uses Levoleucovorin as needed and requests a refill. She also takes finasteride managed by her dermatologist.   Diet: Regular Exercise: None  Last Eye Exam: completed Last Dental Exam: completed  Flowsheet Row Office Visit from 08/03/2024 in [facility]  AUDIT-C Score 0   Depression: Phq 9 is  negative    08/03/2024    1:30 PM 07/15/2023    3:07 PM 04/23/2023   10:32 AM 03/12/2023    9:36 AM 07/13/2022    2:49 PM  Depression screen PHQ 2/9  Decreased Interest 0 0 0 2 0  Down, Depressed, Hopeless 0 0 0 2 0  PHQ - 2 Score 0 0 0 4 0  Altered sleeping   1 0 0  Tired, decreased energy   0 2 0  Change in appetite   0 2 0  Feeling bad or failure about yourself    0 0 0  Trouble concentrating   0 2 0  Moving slowly or fidgety/restless   0 0 0  Suicidal thoughts   0 0 0  PHQ-9 Score   1  10  0   Difficult doing  work/chores     Not difficult at all     Data saved with a previous flowsheet row definition   Hypertension: BP Readings from Last 3 Encounters:  08/03/24 122/68  12/13/23 120/72  07/15/23 120/68   Obesity: Wt Readings from Last 3 Encounters:  08/03/24 136 lb 1.6 oz (61.7 kg)  12/13/23 131 lb (59.4 kg)  07/15/23 135 lb 3.2 oz (61.3 kg)   BMI Readings from Last 3 Encounters:  08/03/24 24.11 kg/m  12/13/23 23.21 kg/m  07/15/23 23.95 kg/m     Vaccines: reviewed with the patient. Patient continues to decline all vaccines.   Hep C Screening: completed STD testing and prevention (HIV/chl/gon/syphilis): no concerns  Intimate partner violence: negative screen  Menstrual History/LMP/Abnormal Bleeding: Post  Menopausal Discussed importance of follow up if any post-menopausal bleeding: no  Incontinence Symptoms: negative for symptoms   Breast cancer:  - Last Mammogram: 06/30/2018, continues to decline all further screening  Osteoporosis Prevention : Discussed high calcium and vitamin D  supplementation, weight bearing exercises Bone density :not applicable   Cervical cancer screening: encouraged to schedule with GYN  Skin cancer: Discussed monitoring for atypical lesions  Colorectal cancer: 07/01/2017 colonoscopy, repeat  in 10 years   Lung cancer:  Low Dose CT Chest recommended if Age 82-80 years, 20 pack-year currently smoking OR have quit w/in 15years. 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   Cholecystitis    Thyroid  disease 07/10/2016    Past Surgical History:  Procedure Laterality Date   BREAST BIOPSY Left 01/29/2018   2 AREAS- [facility]   BREAST CYST ASPIRATION Right 2009   neg   CESAREAN SECTION  1992 and 1994   x2   CHOLECYSTECTOMY N/A 07/20/2016   Procedure: LAPAROSCOPIC CHOLECYSTECTOMY;  Surgeon: Marie FORBES Fell, MD;  Location: [facility] ORS;  Service:  General;  Laterality: N/A;   DILATION AND CURETTAGE OF UTERUS  1990    Family History  Problem Relation Age of Onset   Diabetes Mother    Heart disease Mother    Heart disease Father    Alzheimer's disease Father    Breast cancer Neg Hx     Social History   Socioeconomic History   Marital status: Married    Spouse name: Not on file   Number of children: Not on file   Years of education: Not on file   Highest education level: Not on file  Occupational History   Not on file  Tobacco Use   Smoking status: Never   Smokeless tobacco: Never  Vaping Use   Vaping status: Never Used  Substance and Sexual Activity   Alcohol use: Yes    Comment: Occasional   Drug use: No   Sexual activity: Not Currently  Other Topics Concern   Not on file  Social History Narrative   Not on file   Social Drivers of Health   Tobacco Use: Low Risk (08/03/2024)   Patient History    Smoking Tobacco Use: Never    Smokeless Tobacco Use: Never    Passive Exposure: Not on file  Financial Resource Strain: Low Risk (08/03/2024)   Overall Financial Resource Strain (CARDIA)    Difficulty of Paying Living Expenses: Not hard at all  Food Insecurity: No Food Insecurity (08/03/2024)   Epic    Worried About Programme Researcher, Broadcasting/film/video in the Last Year: Never true    [employer] of Food in the Last Year: Never true  Transportation Needs: No Transportation Needs (08/03/2024)   Epic    Lack of Transportation (Medical): No    Lack of Transportation (Non-Medical): No  Physical Activity: Inactive (08/03/2024)   Exercise Vital Sign    Days of Exercise per Week: 0 days    Minutes of Exercise per Session: 0 min  Stress: No Stress Concern Present (08/03/2024)   [employer] of Occupational Health - Occupational Stress Questionnaire    Feeling of Stress: Not at all  Social Connections: Moderately Integrated (08/03/2024)   Social Connection and Isolation Panel    Frequency of Communication with Friends and Family: More  than three times a week    Frequency of Social Gatherings with Friends and Family: More than three times a week    Attends Religious Services: More than 4 times per year    Active Member of [employer] or Organizations: No    Attends Banker Meetings: Never    Marital Status: Married  Catering Manager Violence: Not At Risk (08/03/2024)   Epic    Fear of Current or Ex-Partner: No    Emotionally Abused: No  Physically Abused: No    Sexually Abused: No  Depression (PHQ2-9): Low Risk (08/03/2024)   Depression (PHQ2-9)    PHQ-2 Score: 0  Alcohol Screen: Low Risk (08/03/2024)   Alcohol Screen    Last Alcohol Screening Score (AUDIT): 0  Housing: Unknown (08/03/2024)   Epic    Unable to Pay for Housing in the Last Year: No    Number of Times Moved in the Last Year: Not on file    Homeless in the Last Year: No  Utilities: Not At Risk (08/03/2024)   Epic    Threatened with loss of utilities: No  Health Literacy: Adequate Health Literacy (08/03/2024)   B1300 Health Literacy    Frequency of need for help with medical instructions: Never    Current Medications[1]  Allergies[2]   Review of Systems  All other systems reviewed and are negative.    Objective  Vitals:   08/03/24 1336  BP: 122/68  Pulse: 79  Resp: 16  Temp: 98 F (36.7 C)  TempSrc: Oral  SpO2: 99%  Weight: 136 lb 1.6 oz (61.7 kg)  Height: 5' 3 (1.6 m)    Body mass index is 24.11 kg/m.  Physical Exam Constitutional:      Appearance: Normal appearance.  HENT:     Head: Normocephalic and atraumatic.     Mouth/Throat:     Mouth: Mucous membranes are moist.     Pharynx: Oropharynx is clear.  Eyes:     Extraocular Movements: Extraocular movements intact.     Conjunctiva/sclera: Conjunctivae normal.     Pupils: Pupils are equal, round, and reactive to light.  Neck:     Comments: No thyromegaly Cardiovascular:     Rate and Rhythm: Normal rate and regular rhythm.  Pulmonary:     Effort: Pulmonary  effort is normal.     Breath sounds: Normal breath sounds.  Musculoskeletal:     Cervical back: No tenderness.     Right lower leg: No edema.     Left lower leg: No edema.  Lymphadenopathy:     Cervical: No cervical adenopathy.  Skin:    General: Skin is warm and dry.  Neurological:     General: No focal deficit present.     Mental Status: She is alert. Mental status is at baseline.  Psychiatric:        Mood and Affect: Mood normal.        Behavior: Behavior normal.      Last CBC Lab Results  Component Value Date   WBC 3.6 12/10/2023   HGB 12.7 12/10/2023   HCT 39.2 12/10/2023   MCV 93 12/10/2023   MCH 30.1 12/10/2023   RDW 12.3 12/10/2023   PLT 272 12/10/2023   Last metabolic panel Lab Results  Component Value Date   GLUCOSE 91 12/10/2023   NA 138 12/10/2023   K 4.5 12/10/2023   CL 101 12/10/2023   CO2 23 12/10/2023   BUN 9 12/10/2023   CREATININE 0.73 12/10/2023   EGFR 94 12/10/2023   CALCIUM 11.4 (H) 12/10/2023   PROT 7.0 12/10/2023   ALBUMIN 4.6 12/10/2023   LABGLOB 2.4 12/10/2023   AGRATIO 1.8 07/06/2022   BILITOT 0.5 12/10/2023   ALKPHOS 60 12/10/2023   AST 21 12/10/2023   ALT 16 12/10/2023   ANIONGAP 9 07/07/2016   Last lipids Lab Results  Component Value Date   CHOL 208 (H) 12/10/2023   HDL 66 12/10/2023   LDLCALC 127 (H) 12/10/2023   TRIG 84  12/10/2023   CHOLHDL 3.2 12/10/2023   Last hemoglobin A1c No results found for: HGBA1C Last thyroid  functions Lab Results  Component Value Date   TSH 2.800 12/10/2023   T4TOTAL 6.1 12/10/2023   FREET4 0.62 (L) 01/05/2022   Last vitamin D  Lab Results  Component Value Date   VD25OH 38.7 12/10/2023   Last vitamin B12 and Folate No results found for: VITAMINB12, FOLATE    Assessment & Plan  Assessment & Plan Annual Physical Routine evaluation with no acute concerns. Family history of heart disease and diabetes noted. Maintenance phase of weight management with regular exercise and  dietary modifications. - Ordered CBC, CMP, thyroid  panel including free T3 and free T4, lipid panel, lipoprotein A, vitamin D , vitamin B12, and A1c. - Provided lab orders for completion at [facility]. - Continue current exercise and dietary regimen for weight maintenance.  Hypothyroidism Reports difficulty sleeping, possibly related to thyroid  function. - Ordered thyroid  panel including free T3 and free T4.  Recurrent UTI - Continue Macrobid  as needed, no urinary symptoms presently.   Hypercalcemia - Recheck CMP today.   - nitrofurantoin  (MACRODANTIN ) 100 MG capsule; Take 1 capsule (100 mg total) by mouth 2 (two) times daily as needed (UTI).  Dispense: 30 capsule; Refill: 1 - CBC w/Diff/Platelet - Comprehensive Metabolic Panel (CMET) - Thyroid  Panel With TSH - Lipid Profile - Lipoprotein A (LPA) - Vitamin D  (25 hydroxy) - Vitamin B12 - HgB A1c - T3, free  -USPSTF grade A and B recommendations reviewed with patient; age-appropriate recommendations, preventive care, screening tests, etc discussed and encouraged; healthy living encouraged; see AVS for patient education given to patient -Discussed importance of 150 minutes of physical activity weekly, eat two servings of fish weekly, eat one serving of tree nuts ( cashews, pistachios, pecans, almonds.SABRA) every other day, eat 6 servings of fruit/vegetables daily and drink plenty of water and avoid sweet beverages.   -Reviewed Health Maintenance: Yes.        [1]  Current Outpatient Medications:    finasteride (PROSCAR) 5 MG tablet, Take 2.5 mg by mouth daily., Disp: , Rfl:    levothyroxine  (SYNTHROID ) 25 MCG tablet, TAKE 1 AND 1/2 TABLETS BY MOUTH DAILY( 30 MINUTES PRIOR TO FOOD OR DRINK), Disp: 135 tablet, Rfl: 1   liothyronine  (CYTOMEL ) 25 MCG tablet, TAKE 1 TABLET DAILY ON AN EMPTY STOMACH WITH A GLASS OF WATER AT LEAST 30-60 MINS BEFORE BREAKFAST., Disp: 90 tablet, Rfl: 0   nitrofurantoin  (MACRODANTIN ) 100 MG capsule, Take 1 capsule  (100 mg total) by mouth 2 (two) times daily as needed (UTI)., Disp: 30 capsule, Rfl: 1 [2]  Allergies Allergen Reactions   Other Rash    DOGS, HORSES   Sulfa Antibiotics Rash    Lips swelling, rash

## 2025-08-05 ENCOUNTER — Encounter: Admitting: Internal Medicine
